# Patient Record
Sex: Male | Born: 1950
Health system: Southern US, Community
[De-identification: ages and names within clinical notes are randomized; demographics above are authoritative.]

## PROBLEM LIST (undated history)

## (undated) DIAGNOSIS — I447 Left bundle-branch block, unspecified: Secondary | ICD-10-CM

## (undated) DIAGNOSIS — E785 Hyperlipidemia, unspecified: Secondary | ICD-10-CM

## (undated) DIAGNOSIS — M1990 Unspecified osteoarthritis, unspecified site: Secondary | ICD-10-CM

## (undated) DIAGNOSIS — I10 Essential (primary) hypertension: Secondary | ICD-10-CM

## (undated) DIAGNOSIS — G4730 Sleep apnea, unspecified: Secondary | ICD-10-CM

## (undated) DIAGNOSIS — M109 Gout, unspecified: Secondary | ICD-10-CM

## (undated) DIAGNOSIS — K219 Gastro-esophageal reflux disease without esophagitis: Secondary | ICD-10-CM

## (undated) DIAGNOSIS — H409 Unspecified glaucoma: Secondary | ICD-10-CM

## (undated) DIAGNOSIS — Z87442 Personal history of urinary calculi: Secondary | ICD-10-CM

## (undated) DIAGNOSIS — K759 Inflammatory liver disease, unspecified: Secondary | ICD-10-CM

## (undated) HISTORY — DX: Hyperlipidemia, unspecified: E78.5

## (undated) HISTORY — DX: Unspecified glaucoma: H40.9

## (undated) HISTORY — DX: Gastro-esophageal reflux disease without esophagitis: K21.9

## (undated) HISTORY — PX: POLYPECTOMY: SHX149

## (undated) HISTORY — DX: Essential (primary) hypertension: I10

## (undated) HISTORY — DX: Sleep apnea, unspecified: G47.30

## (undated) HISTORY — PX: COLONOSCOPY: SHX174

## (undated) HISTORY — PX: ROTATOR CUFF REPAIR: SHX139

---

## 1998-10-28 DIAGNOSIS — N200 Calculus of kidney: Secondary | ICD-10-CM

## 1998-10-28 HISTORY — DX: Calculus of kidney: N20.0

## 2007-01-28 ENCOUNTER — Ambulatory Visit (HOSPITAL_BASED_OUTPATIENT_CLINIC_OR_DEPARTMENT_OTHER): Admission: RE | Admit: 2007-01-28 | Discharge: 2007-01-28 | Payer: Self-pay | Admitting: Orthopedic Surgery

## 2007-10-29 HISTORY — PX: ANKLE RECONSTRUCTION: SHX1151

## 2011-04-24 ENCOUNTER — Emergency Department (HOSPITAL_COMMUNITY)
Admission: EM | Admit: 2011-04-24 | Discharge: 2011-04-24 | Disposition: A | Discharge status: Home / Self Care | Payer: PRIVATE HEALTH INSURANCE | Attending: Emergency Medicine | Admitting: Emergency Medicine

## 2011-04-24 DIAGNOSIS — I10 Essential (primary) hypertension: Secondary | ICD-10-CM | POA: Insufficient documentation

## 2011-04-24 DIAGNOSIS — E7800 Pure hypercholesterolemia, unspecified: Secondary | ICD-10-CM | POA: Insufficient documentation

## 2011-04-24 DIAGNOSIS — R339 Retention of urine, unspecified: Secondary | ICD-10-CM | POA: Insufficient documentation

## 2011-04-24 DIAGNOSIS — E869 Volume depletion, unspecified: Secondary | ICD-10-CM | POA: Insufficient documentation

## 2011-04-24 LAB — POCT I-STAT, CHEM 8
Calcium, Ion: 1.09 mmol/L — ABNORMAL LOW (ref 1.12–1.32)
Chloride: 106 mEq/L (ref 96–112)
HCT: 38 % — ABNORMAL LOW (ref 39.0–52.0)
Hemoglobin: 12.9 g/dL — ABNORMAL LOW (ref 13.0–17.0)
Potassium: 4 mEq/L (ref 3.5–5.1)

## 2011-04-24 LAB — URINALYSIS, ROUTINE W REFLEX MICROSCOPIC
Leukocytes, UA: NEGATIVE
Nitrite: NEGATIVE
Specific Gravity, Urine: 1.013 (ref 1.005–1.030)
pH: 7.5 (ref 5.0–8.0)

## 2016-01-10 DIAGNOSIS — H2513 Age-related nuclear cataract, bilateral: Secondary | ICD-10-CM | POA: Diagnosis not present

## 2016-01-10 DIAGNOSIS — R238 Other skin changes: Secondary | ICD-10-CM | POA: Diagnosis not present

## 2016-01-10 DIAGNOSIS — H401123 Primary open-angle glaucoma, left eye, severe stage: Secondary | ICD-10-CM | POA: Diagnosis not present

## 2016-01-10 DIAGNOSIS — M25569 Pain in unspecified knee: Secondary | ICD-10-CM | POA: Diagnosis not present

## 2016-01-10 DIAGNOSIS — H401113 Primary open-angle glaucoma, right eye, severe stage: Secondary | ICD-10-CM | POA: Diagnosis not present

## 2016-01-10 DIAGNOSIS — Z791 Long term (current) use of non-steroidal anti-inflammatories (NSAID): Secondary | ICD-10-CM | POA: Diagnosis not present

## 2016-01-10 DIAGNOSIS — Z79899 Other long term (current) drug therapy: Secondary | ICD-10-CM | POA: Diagnosis not present

## 2016-03-04 DIAGNOSIS — L570 Actinic keratosis: Secondary | ICD-10-CM | POA: Diagnosis not present

## 2016-03-04 DIAGNOSIS — L821 Other seborrheic keratosis: Secondary | ICD-10-CM | POA: Diagnosis not present

## 2016-03-04 DIAGNOSIS — D499 Neoplasm of unspecified behavior of unspecified site: Secondary | ICD-10-CM | POA: Diagnosis not present

## 2016-04-10 DIAGNOSIS — H401123 Primary open-angle glaucoma, left eye, severe stage: Secondary | ICD-10-CM | POA: Diagnosis not present

## 2016-04-10 DIAGNOSIS — Z79899 Other long term (current) drug therapy: Secondary | ICD-10-CM | POA: Diagnosis not present

## 2016-04-10 DIAGNOSIS — Z791 Long term (current) use of non-steroidal anti-inflammatories (NSAID): Secondary | ICD-10-CM | POA: Diagnosis not present

## 2016-04-10 DIAGNOSIS — H47293 Other optic atrophy, bilateral: Secondary | ICD-10-CM | POA: Diagnosis not present

## 2016-04-10 DIAGNOSIS — H2513 Age-related nuclear cataract, bilateral: Secondary | ICD-10-CM | POA: Diagnosis not present

## 2016-04-10 DIAGNOSIS — Z792 Long term (current) use of antibiotics: Secondary | ICD-10-CM | POA: Diagnosis not present

## 2016-04-10 DIAGNOSIS — H401113 Primary open-angle glaucoma, right eye, severe stage: Secondary | ICD-10-CM | POA: Diagnosis not present

## 2016-05-20 DIAGNOSIS — Z471 Aftercare following joint replacement surgery: Secondary | ICD-10-CM | POA: Diagnosis not present

## 2016-05-20 DIAGNOSIS — Z96661 Presence of right artificial ankle joint: Secondary | ICD-10-CM | POA: Diagnosis not present

## 2016-08-07 DIAGNOSIS — H401123 Primary open-angle glaucoma, left eye, severe stage: Secondary | ICD-10-CM | POA: Diagnosis not present

## 2016-08-07 DIAGNOSIS — H401113 Primary open-angle glaucoma, right eye, severe stage: Secondary | ICD-10-CM | POA: Diagnosis not present

## 2016-08-07 DIAGNOSIS — H401133 Primary open-angle glaucoma, bilateral, severe stage: Secondary | ICD-10-CM | POA: Diagnosis not present

## 2016-08-26 DIAGNOSIS — E7800 Pure hypercholesterolemia, unspecified: Secondary | ICD-10-CM | POA: Diagnosis not present

## 2016-08-26 DIAGNOSIS — Z79899 Other long term (current) drug therapy: Secondary | ICD-10-CM | POA: Diagnosis not present

## 2016-08-26 DIAGNOSIS — Z23 Encounter for immunization: Secondary | ICD-10-CM | POA: Diagnosis not present

## 2016-08-26 DIAGNOSIS — K219 Gastro-esophageal reflux disease without esophagitis: Secondary | ICD-10-CM | POA: Diagnosis not present

## 2016-08-26 DIAGNOSIS — Z125 Encounter for screening for malignant neoplasm of prostate: Secondary | ICD-10-CM | POA: Diagnosis not present

## 2016-08-26 DIAGNOSIS — I10 Essential (primary) hypertension: Secondary | ICD-10-CM | POA: Diagnosis not present

## 2016-08-26 DIAGNOSIS — E669 Obesity, unspecified: Secondary | ICD-10-CM | POA: Diagnosis not present

## 2016-08-26 DIAGNOSIS — G4730 Sleep apnea, unspecified: Secondary | ICD-10-CM | POA: Diagnosis not present

## 2016-08-26 DIAGNOSIS — M150 Primary generalized (osteo)arthritis: Secondary | ICD-10-CM | POA: Diagnosis not present

## 2016-08-26 DIAGNOSIS — Z0000 Encounter for general adult medical examination without abnormal findings: Secondary | ICD-10-CM | POA: Diagnosis not present

## 2016-08-26 DIAGNOSIS — Z6833 Body mass index (BMI) 33.0-33.9, adult: Secondary | ICD-10-CM | POA: Diagnosis not present

## 2016-08-26 DIAGNOSIS — M1000 Idiopathic gout, unspecified site: Secondary | ICD-10-CM | POA: Diagnosis not present

## 2016-08-27 DIAGNOSIS — Z125 Encounter for screening for malignant neoplasm of prostate: Secondary | ICD-10-CM | POA: Diagnosis not present

## 2016-08-27 DIAGNOSIS — G4730 Sleep apnea, unspecified: Secondary | ICD-10-CM | POA: Diagnosis not present

## 2016-08-27 DIAGNOSIS — E7800 Pure hypercholesterolemia, unspecified: Secondary | ICD-10-CM | POA: Diagnosis not present

## 2016-08-27 DIAGNOSIS — Z0000 Encounter for general adult medical examination without abnormal findings: Secondary | ICD-10-CM | POA: Diagnosis not present

## 2016-08-27 DIAGNOSIS — I10 Essential (primary) hypertension: Secondary | ICD-10-CM | POA: Diagnosis not present

## 2016-08-27 DIAGNOSIS — M1000 Idiopathic gout, unspecified site: Secondary | ICD-10-CM | POA: Diagnosis not present

## 2016-08-27 DIAGNOSIS — Z79899 Other long term (current) drug therapy: Secondary | ICD-10-CM | POA: Diagnosis not present

## 2016-09-05 DIAGNOSIS — R531 Weakness: Secondary | ICD-10-CM | POA: Diagnosis not present

## 2016-09-05 DIAGNOSIS — L400 Psoriasis vulgaris: Secondary | ICD-10-CM | POA: Diagnosis not present

## 2016-09-05 DIAGNOSIS — L4050 Arthropathic psoriasis, unspecified: Secondary | ICD-10-CM | POA: Diagnosis not present

## 2016-11-11 DIAGNOSIS — J069 Acute upper respiratory infection, unspecified: Secondary | ICD-10-CM | POA: Diagnosis not present

## 2016-11-25 DIAGNOSIS — J209 Acute bronchitis, unspecified: Secondary | ICD-10-CM | POA: Diagnosis not present

## 2016-12-02 DIAGNOSIS — H6121 Impacted cerumen, right ear: Secondary | ICD-10-CM | POA: Diagnosis not present

## 2016-12-12 DIAGNOSIS — H401123 Primary open-angle glaucoma, left eye, severe stage: Secondary | ICD-10-CM | POA: Diagnosis not present

## 2016-12-12 DIAGNOSIS — H2513 Age-related nuclear cataract, bilateral: Secondary | ICD-10-CM | POA: Diagnosis not present

## 2016-12-12 DIAGNOSIS — H401113 Primary open-angle glaucoma, right eye, severe stage: Secondary | ICD-10-CM | POA: Diagnosis not present

## 2016-12-23 DIAGNOSIS — L570 Actinic keratosis: Secondary | ICD-10-CM | POA: Diagnosis not present

## 2016-12-23 DIAGNOSIS — G4730 Sleep apnea, unspecified: Secondary | ICD-10-CM | POA: Diagnosis not present

## 2017-01-17 DIAGNOSIS — G4733 Obstructive sleep apnea (adult) (pediatric): Secondary | ICD-10-CM | POA: Diagnosis not present

## 2017-01-17 DIAGNOSIS — J301 Allergic rhinitis due to pollen: Secondary | ICD-10-CM | POA: Diagnosis not present

## 2017-01-17 DIAGNOSIS — J4530 Mild persistent asthma, uncomplicated: Secondary | ICD-10-CM | POA: Diagnosis not present

## 2017-01-21 DIAGNOSIS — L400 Psoriasis vulgaris: Secondary | ICD-10-CM | POA: Diagnosis not present

## 2017-01-21 DIAGNOSIS — L4050 Arthropathic psoriasis, unspecified: Secondary | ICD-10-CM | POA: Diagnosis not present

## 2017-01-30 DIAGNOSIS — H401123 Primary open-angle glaucoma, left eye, severe stage: Secondary | ICD-10-CM | POA: Diagnosis not present

## 2017-01-30 DIAGNOSIS — H401113 Primary open-angle glaucoma, right eye, severe stage: Secondary | ICD-10-CM | POA: Diagnosis not present

## 2017-02-07 DIAGNOSIS — H401123 Primary open-angle glaucoma, left eye, severe stage: Secondary | ICD-10-CM | POA: Diagnosis not present

## 2017-02-12 DIAGNOSIS — G4733 Obstructive sleep apnea (adult) (pediatric): Secondary | ICD-10-CM | POA: Diagnosis not present

## 2017-02-20 DIAGNOSIS — J4520 Mild intermittent asthma, uncomplicated: Secondary | ICD-10-CM | POA: Diagnosis not present

## 2017-02-20 DIAGNOSIS — G4700 Insomnia, unspecified: Secondary | ICD-10-CM | POA: Diagnosis not present

## 2017-02-20 DIAGNOSIS — J301 Allergic rhinitis due to pollen: Secondary | ICD-10-CM | POA: Diagnosis not present

## 2017-02-20 DIAGNOSIS — G4733 Obstructive sleep apnea (adult) (pediatric): Secondary | ICD-10-CM | POA: Diagnosis not present

## 2017-02-24 DIAGNOSIS — Z79899 Other long term (current) drug therapy: Secondary | ICD-10-CM | POA: Diagnosis not present

## 2017-02-24 DIAGNOSIS — I10 Essential (primary) hypertension: Secondary | ICD-10-CM | POA: Diagnosis not present

## 2017-02-24 DIAGNOSIS — E7800 Pure hypercholesterolemia, unspecified: Secondary | ICD-10-CM | POA: Diagnosis not present

## 2017-02-25 DIAGNOSIS — Z79899 Other long term (current) drug therapy: Secondary | ICD-10-CM | POA: Diagnosis not present

## 2017-02-25 DIAGNOSIS — L4050 Arthropathic psoriasis, unspecified: Secondary | ICD-10-CM | POA: Diagnosis not present

## 2017-02-25 DIAGNOSIS — E7800 Pure hypercholesterolemia, unspecified: Secondary | ICD-10-CM | POA: Diagnosis not present

## 2017-02-25 DIAGNOSIS — I10 Essential (primary) hypertension: Secondary | ICD-10-CM | POA: Diagnosis not present

## 2017-02-25 DIAGNOSIS — L400 Psoriasis vulgaris: Secondary | ICD-10-CM | POA: Diagnosis not present

## 2017-03-03 DIAGNOSIS — Z79899 Other long term (current) drug therapy: Secondary | ICD-10-CM | POA: Diagnosis not present

## 2017-03-05 DIAGNOSIS — H401123 Primary open-angle glaucoma, left eye, severe stage: Secondary | ICD-10-CM | POA: Diagnosis not present

## 2017-03-05 DIAGNOSIS — H2513 Age-related nuclear cataract, bilateral: Secondary | ICD-10-CM | POA: Diagnosis not present

## 2017-03-05 DIAGNOSIS — H401113 Primary open-angle glaucoma, right eye, severe stage: Secondary | ICD-10-CM | POA: Diagnosis not present

## 2017-03-05 DIAGNOSIS — H401133 Primary open-angle glaucoma, bilateral, severe stage: Secondary | ICD-10-CM | POA: Diagnosis not present

## 2017-03-28 DIAGNOSIS — L4050 Arthropathic psoriasis, unspecified: Secondary | ICD-10-CM | POA: Diagnosis not present

## 2017-03-28 DIAGNOSIS — L400 Psoriasis vulgaris: Secondary | ICD-10-CM | POA: Diagnosis not present

## 2017-04-02 DIAGNOSIS — R05 Cough: Secondary | ICD-10-CM | POA: Diagnosis not present

## 2017-04-10 DIAGNOSIS — G4733 Obstructive sleep apnea (adult) (pediatric): Secondary | ICD-10-CM | POA: Diagnosis not present

## 2017-05-09 DIAGNOSIS — G4733 Obstructive sleep apnea (adult) (pediatric): Secondary | ICD-10-CM | POA: Diagnosis not present

## 2017-05-13 DIAGNOSIS — G4733 Obstructive sleep apnea (adult) (pediatric): Secondary | ICD-10-CM | POA: Diagnosis not present

## 2017-06-05 DIAGNOSIS — H401123 Primary open-angle glaucoma, left eye, severe stage: Secondary | ICD-10-CM | POA: Diagnosis not present

## 2017-06-05 DIAGNOSIS — H2513 Age-related nuclear cataract, bilateral: Secondary | ICD-10-CM | POA: Diagnosis not present

## 2017-06-05 DIAGNOSIS — H401113 Primary open-angle glaucoma, right eye, severe stage: Secondary | ICD-10-CM | POA: Diagnosis not present

## 2017-06-05 DIAGNOSIS — H401133 Primary open-angle glaucoma, bilateral, severe stage: Secondary | ICD-10-CM | POA: Diagnosis not present

## 2017-06-09 DIAGNOSIS — G4733 Obstructive sleep apnea (adult) (pediatric): Secondary | ICD-10-CM | POA: Diagnosis not present

## 2017-06-11 DIAGNOSIS — G4733 Obstructive sleep apnea (adult) (pediatric): Secondary | ICD-10-CM | POA: Diagnosis not present

## 2017-06-16 DIAGNOSIS — R5383 Other fatigue: Secondary | ICD-10-CM | POA: Diagnosis not present

## 2017-06-16 DIAGNOSIS — G4761 Periodic limb movement disorder: Secondary | ICD-10-CM | POA: Diagnosis not present

## 2017-06-16 DIAGNOSIS — G4733 Obstructive sleep apnea (adult) (pediatric): Secondary | ICD-10-CM | POA: Diagnosis not present

## 2017-07-01 DIAGNOSIS — L4050 Arthropathic psoriasis, unspecified: Secondary | ICD-10-CM | POA: Diagnosis not present

## 2017-07-01 DIAGNOSIS — R531 Weakness: Secondary | ICD-10-CM | POA: Diagnosis not present

## 2017-07-01 DIAGNOSIS — L400 Psoriasis vulgaris: Secondary | ICD-10-CM | POA: Diagnosis not present

## 2017-07-10 DIAGNOSIS — G4733 Obstructive sleep apnea (adult) (pediatric): Secondary | ICD-10-CM | POA: Diagnosis not present

## 2017-07-21 DIAGNOSIS — H401133 Primary open-angle glaucoma, bilateral, severe stage: Secondary | ICD-10-CM | POA: Diagnosis not present

## 2017-07-21 DIAGNOSIS — H401113 Primary open-angle glaucoma, right eye, severe stage: Secondary | ICD-10-CM | POA: Diagnosis not present

## 2017-07-21 DIAGNOSIS — H401123 Primary open-angle glaucoma, left eye, severe stage: Secondary | ICD-10-CM | POA: Diagnosis not present

## 2017-07-28 DIAGNOSIS — G4733 Obstructive sleep apnea (adult) (pediatric): Secondary | ICD-10-CM | POA: Diagnosis not present

## 2017-07-28 DIAGNOSIS — L400 Psoriasis vulgaris: Secondary | ICD-10-CM | POA: Diagnosis not present

## 2017-07-28 DIAGNOSIS — L4050 Arthropathic psoriasis, unspecified: Secondary | ICD-10-CM | POA: Diagnosis not present

## 2017-07-28 DIAGNOSIS — G4761 Periodic limb movement disorder: Secondary | ICD-10-CM | POA: Diagnosis not present

## 2017-07-28 DIAGNOSIS — J301 Allergic rhinitis due to pollen: Secondary | ICD-10-CM | POA: Diagnosis not present

## 2017-08-09 DIAGNOSIS — G4733 Obstructive sleep apnea (adult) (pediatric): Secondary | ICD-10-CM | POA: Diagnosis not present

## 2017-09-09 DIAGNOSIS — G4733 Obstructive sleep apnea (adult) (pediatric): Secondary | ICD-10-CM | POA: Diagnosis not present

## 2017-09-25 DIAGNOSIS — G4733 Obstructive sleep apnea (adult) (pediatric): Secondary | ICD-10-CM | POA: Diagnosis not present

## 2017-10-09 DIAGNOSIS — G4733 Obstructive sleep apnea (adult) (pediatric): Secondary | ICD-10-CM | POA: Diagnosis not present

## 2017-11-03 DIAGNOSIS — J301 Allergic rhinitis due to pollen: Secondary | ICD-10-CM | POA: Diagnosis not present

## 2017-11-03 DIAGNOSIS — J4520 Mild intermittent asthma, uncomplicated: Secondary | ICD-10-CM | POA: Diagnosis not present

## 2017-11-03 DIAGNOSIS — G4733 Obstructive sleep apnea (adult) (pediatric): Secondary | ICD-10-CM | POA: Diagnosis not present

## 2017-11-03 DIAGNOSIS — G4761 Periodic limb movement disorder: Secondary | ICD-10-CM | POA: Diagnosis not present

## 2017-11-24 DIAGNOSIS — H401123 Primary open-angle glaucoma, left eye, severe stage: Secondary | ICD-10-CM | POA: Diagnosis not present

## 2017-11-24 DIAGNOSIS — H401133 Primary open-angle glaucoma, bilateral, severe stage: Secondary | ICD-10-CM | POA: Diagnosis not present

## 2017-11-24 DIAGNOSIS — H401113 Primary open-angle glaucoma, right eye, severe stage: Secondary | ICD-10-CM | POA: Diagnosis not present

## 2017-12-22 DIAGNOSIS — H401133 Primary open-angle glaucoma, bilateral, severe stage: Secondary | ICD-10-CM | POA: Diagnosis not present

## 2017-12-22 DIAGNOSIS — H401113 Primary open-angle glaucoma, right eye, severe stage: Secondary | ICD-10-CM | POA: Diagnosis not present

## 2017-12-22 DIAGNOSIS — H401123 Primary open-angle glaucoma, left eye, severe stage: Secondary | ICD-10-CM | POA: Diagnosis not present

## 2018-02-02 DIAGNOSIS — J301 Allergic rhinitis due to pollen: Secondary | ICD-10-CM | POA: Diagnosis not present

## 2018-02-02 DIAGNOSIS — L400 Psoriasis vulgaris: Secondary | ICD-10-CM | POA: Diagnosis not present

## 2018-02-02 DIAGNOSIS — J4520 Mild intermittent asthma, uncomplicated: Secondary | ICD-10-CM | POA: Diagnosis not present

## 2018-02-02 DIAGNOSIS — G4761 Periodic limb movement disorder: Secondary | ICD-10-CM | POA: Diagnosis not present

## 2018-02-02 DIAGNOSIS — G4733 Obstructive sleep apnea (adult) (pediatric): Secondary | ICD-10-CM | POA: Diagnosis not present

## 2018-02-02 DIAGNOSIS — L4050 Arthropathic psoriasis, unspecified: Secondary | ICD-10-CM | POA: Diagnosis not present

## 2018-03-25 DIAGNOSIS — H401133 Primary open-angle glaucoma, bilateral, severe stage: Secondary | ICD-10-CM | POA: Diagnosis not present

## 2018-03-25 DIAGNOSIS — Z79899 Other long term (current) drug therapy: Secondary | ICD-10-CM | POA: Diagnosis not present

## 2018-03-25 DIAGNOSIS — H401123 Primary open-angle glaucoma, left eye, severe stage: Secondary | ICD-10-CM | POA: Diagnosis not present

## 2018-03-25 DIAGNOSIS — H401113 Primary open-angle glaucoma, right eye, severe stage: Secondary | ICD-10-CM | POA: Diagnosis not present

## 2018-04-13 DIAGNOSIS — L400 Psoriasis vulgaris: Secondary | ICD-10-CM | POA: Diagnosis not present

## 2018-04-13 DIAGNOSIS — R531 Weakness: Secondary | ICD-10-CM | POA: Diagnosis not present

## 2018-04-20 DIAGNOSIS — I10 Essential (primary) hypertension: Secondary | ICD-10-CM | POA: Diagnosis not present

## 2018-04-20 DIAGNOSIS — Z79899 Other long term (current) drug therapy: Secondary | ICD-10-CM | POA: Diagnosis not present

## 2018-04-20 DIAGNOSIS — E669 Obesity, unspecified: Secondary | ICD-10-CM | POA: Diagnosis not present

## 2018-04-20 DIAGNOSIS — Z6834 Body mass index (BMI) 34.0-34.9, adult: Secondary | ICD-10-CM | POA: Diagnosis not present

## 2018-04-20 DIAGNOSIS — E7800 Pure hypercholesterolemia, unspecified: Secondary | ICD-10-CM | POA: Diagnosis not present

## 2018-05-25 DIAGNOSIS — M25571 Pain in right ankle and joints of right foot: Secondary | ICD-10-CM | POA: Diagnosis not present

## 2018-05-25 DIAGNOSIS — Z96661 Presence of right artificial ankle joint: Secondary | ICD-10-CM | POA: Diagnosis not present

## 2018-06-08 DIAGNOSIS — G4761 Periodic limb movement disorder: Secondary | ICD-10-CM | POA: Diagnosis not present

## 2018-06-08 DIAGNOSIS — J301 Allergic rhinitis due to pollen: Secondary | ICD-10-CM | POA: Diagnosis not present

## 2018-06-08 DIAGNOSIS — G4733 Obstructive sleep apnea (adult) (pediatric): Secondary | ICD-10-CM | POA: Diagnosis not present

## 2018-06-08 DIAGNOSIS — J4520 Mild intermittent asthma, uncomplicated: Secondary | ICD-10-CM | POA: Diagnosis not present

## 2018-07-20 DIAGNOSIS — H401133 Primary open-angle glaucoma, bilateral, severe stage: Secondary | ICD-10-CM | POA: Diagnosis not present

## 2018-07-20 DIAGNOSIS — H401113 Primary open-angle glaucoma, right eye, severe stage: Secondary | ICD-10-CM | POA: Diagnosis not present

## 2018-07-20 DIAGNOSIS — H401123 Primary open-angle glaucoma, left eye, severe stage: Secondary | ICD-10-CM | POA: Diagnosis not present

## 2018-08-10 DIAGNOSIS — L400 Psoriasis vulgaris: Secondary | ICD-10-CM | POA: Diagnosis not present

## 2018-08-10 DIAGNOSIS — L578 Other skin changes due to chronic exposure to nonionizing radiation: Secondary | ICD-10-CM | POA: Diagnosis not present

## 2018-08-10 DIAGNOSIS — L4050 Arthropathic psoriasis, unspecified: Secondary | ICD-10-CM | POA: Diagnosis not present

## 2018-09-09 DIAGNOSIS — H401133 Primary open-angle glaucoma, bilateral, severe stage: Secondary | ICD-10-CM | POA: Diagnosis not present

## 2018-09-09 DIAGNOSIS — H401123 Primary open-angle glaucoma, left eye, severe stage: Secondary | ICD-10-CM | POA: Diagnosis not present

## 2018-09-09 DIAGNOSIS — H401113 Primary open-angle glaucoma, right eye, severe stage: Secondary | ICD-10-CM | POA: Diagnosis not present

## 2018-09-28 DIAGNOSIS — R531 Weakness: Secondary | ICD-10-CM | POA: Diagnosis not present

## 2018-09-28 DIAGNOSIS — E785 Hyperlipidemia, unspecified: Secondary | ICD-10-CM | POA: Diagnosis not present

## 2018-09-28 DIAGNOSIS — L400 Psoriasis vulgaris: Secondary | ICD-10-CM | POA: Diagnosis not present

## 2018-10-19 DIAGNOSIS — Z6834 Body mass index (BMI) 34.0-34.9, adult: Secondary | ICD-10-CM | POA: Diagnosis not present

## 2018-10-19 DIAGNOSIS — Z0000 Encounter for general adult medical examination without abnormal findings: Secondary | ICD-10-CM | POA: Diagnosis not present

## 2018-10-19 DIAGNOSIS — M545 Low back pain: Secondary | ICD-10-CM | POA: Diagnosis not present

## 2018-10-19 DIAGNOSIS — G4730 Sleep apnea, unspecified: Secondary | ICD-10-CM | POA: Diagnosis not present

## 2018-10-19 DIAGNOSIS — E7800 Pure hypercholesterolemia, unspecified: Secondary | ICD-10-CM | POA: Diagnosis not present

## 2018-10-19 DIAGNOSIS — K219 Gastro-esophageal reflux disease without esophagitis: Secondary | ICD-10-CM | POA: Diagnosis not present

## 2018-10-19 DIAGNOSIS — Z23 Encounter for immunization: Secondary | ICD-10-CM | POA: Diagnosis not present

## 2018-10-19 DIAGNOSIS — L409 Psoriasis, unspecified: Secondary | ICD-10-CM | POA: Diagnosis not present

## 2018-10-19 DIAGNOSIS — Z79899 Other long term (current) drug therapy: Secondary | ICD-10-CM | POA: Diagnosis not present

## 2018-10-19 DIAGNOSIS — M150 Primary generalized (osteo)arthritis: Secondary | ICD-10-CM | POA: Diagnosis not present

## 2018-10-19 DIAGNOSIS — E669 Obesity, unspecified: Secondary | ICD-10-CM | POA: Diagnosis not present

## 2018-10-19 DIAGNOSIS — I10 Essential (primary) hypertension: Secondary | ICD-10-CM | POA: Diagnosis not present

## 2018-10-20 ENCOUNTER — Encounter: Payer: Self-pay | Admitting: Gastroenterology

## 2018-11-02 DIAGNOSIS — M545 Low back pain: Secondary | ICD-10-CM | POA: Diagnosis not present

## 2018-11-02 DIAGNOSIS — M5442 Lumbago with sciatica, left side: Secondary | ICD-10-CM | POA: Diagnosis not present

## 2018-11-05 DIAGNOSIS — M5442 Lumbago with sciatica, left side: Secondary | ICD-10-CM | POA: Diagnosis not present

## 2018-11-05 DIAGNOSIS — M545 Low back pain: Secondary | ICD-10-CM | POA: Diagnosis not present

## 2018-11-09 DIAGNOSIS — M545 Low back pain: Secondary | ICD-10-CM | POA: Diagnosis not present

## 2018-11-09 DIAGNOSIS — M5442 Lumbago with sciatica, left side: Secondary | ICD-10-CM | POA: Diagnosis not present

## 2018-11-12 DIAGNOSIS — M5442 Lumbago with sciatica, left side: Secondary | ICD-10-CM | POA: Diagnosis not present

## 2018-11-12 DIAGNOSIS — M545 Low back pain: Secondary | ICD-10-CM | POA: Diagnosis not present

## 2018-11-17 DIAGNOSIS — M5442 Lumbago with sciatica, left side: Secondary | ICD-10-CM | POA: Diagnosis not present

## 2018-11-17 DIAGNOSIS — M545 Low back pain: Secondary | ICD-10-CM | POA: Diagnosis not present

## 2018-11-23 DIAGNOSIS — H401133 Primary open-angle glaucoma, bilateral, severe stage: Secondary | ICD-10-CM | POA: Diagnosis not present

## 2018-11-27 DIAGNOSIS — H401123 Primary open-angle glaucoma, left eye, severe stage: Secondary | ICD-10-CM | POA: Diagnosis not present

## 2018-12-07 DIAGNOSIS — J4520 Mild intermittent asthma, uncomplicated: Secondary | ICD-10-CM | POA: Diagnosis not present

## 2018-12-07 DIAGNOSIS — G4733 Obstructive sleep apnea (adult) (pediatric): Secondary | ICD-10-CM | POA: Diagnosis not present

## 2018-12-07 DIAGNOSIS — J301 Allergic rhinitis due to pollen: Secondary | ICD-10-CM | POA: Diagnosis not present

## 2018-12-07 DIAGNOSIS — G4761 Periodic limb movement disorder: Secondary | ICD-10-CM | POA: Diagnosis not present

## 2018-12-21 DIAGNOSIS — H401123 Primary open-angle glaucoma, left eye, severe stage: Secondary | ICD-10-CM | POA: Diagnosis not present

## 2018-12-21 DIAGNOSIS — H401113 Primary open-angle glaucoma, right eye, severe stage: Secondary | ICD-10-CM | POA: Diagnosis not present

## 2019-01-06 DIAGNOSIS — G4733 Obstructive sleep apnea (adult) (pediatric): Secondary | ICD-10-CM | POA: Diagnosis not present

## 2019-01-25 DIAGNOSIS — E7800 Pure hypercholesterolemia, unspecified: Secondary | ICD-10-CM | POA: Diagnosis not present

## 2019-01-25 DIAGNOSIS — Z6835 Body mass index (BMI) 35.0-35.9, adult: Secondary | ICD-10-CM | POA: Diagnosis not present

## 2019-01-25 DIAGNOSIS — M545 Low back pain: Secondary | ICD-10-CM | POA: Diagnosis not present

## 2019-01-25 DIAGNOSIS — D492 Neoplasm of unspecified behavior of bone, soft tissue, and skin: Secondary | ICD-10-CM | POA: Diagnosis not present

## 2019-01-25 DIAGNOSIS — E669 Obesity, unspecified: Secondary | ICD-10-CM | POA: Diagnosis not present

## 2019-01-25 DIAGNOSIS — I10 Essential (primary) hypertension: Secondary | ICD-10-CM | POA: Diagnosis not present

## 2019-02-15 DIAGNOSIS — L4050 Arthropathic psoriasis, unspecified: Secondary | ICD-10-CM | POA: Diagnosis not present

## 2019-02-15 DIAGNOSIS — L400 Psoriasis vulgaris: Secondary | ICD-10-CM | POA: Diagnosis not present

## 2019-02-22 DIAGNOSIS — H401113 Primary open-angle glaucoma, right eye, severe stage: Secondary | ICD-10-CM | POA: Diagnosis not present

## 2019-02-22 DIAGNOSIS — H401123 Primary open-angle glaucoma, left eye, severe stage: Secondary | ICD-10-CM | POA: Diagnosis not present

## 2019-05-06 DIAGNOSIS — D492 Neoplasm of unspecified behavior of bone, soft tissue, and skin: Secondary | ICD-10-CM | POA: Diagnosis not present

## 2019-05-24 DIAGNOSIS — H401123 Primary open-angle glaucoma, left eye, severe stage: Secondary | ICD-10-CM | POA: Diagnosis not present

## 2019-05-24 DIAGNOSIS — H401113 Primary open-angle glaucoma, right eye, severe stage: Secondary | ICD-10-CM | POA: Diagnosis not present

## 2019-05-24 DIAGNOSIS — H401133 Primary open-angle glaucoma, bilateral, severe stage: Secondary | ICD-10-CM | POA: Diagnosis not present

## 2019-05-24 DIAGNOSIS — Z79899 Other long term (current) drug therapy: Secondary | ICD-10-CM | POA: Diagnosis not present

## 2019-05-26 DIAGNOSIS — C4442 Squamous cell carcinoma of skin of scalp and neck: Secondary | ICD-10-CM | POA: Diagnosis not present

## 2019-07-26 DIAGNOSIS — Z79899 Other long term (current) drug therapy: Secondary | ICD-10-CM | POA: Diagnosis not present

## 2019-07-26 DIAGNOSIS — I10 Essential (primary) hypertension: Secondary | ICD-10-CM | POA: Diagnosis not present

## 2019-07-26 DIAGNOSIS — E7800 Pure hypercholesterolemia, unspecified: Secondary | ICD-10-CM | POA: Diagnosis not present

## 2019-07-26 DIAGNOSIS — E669 Obesity, unspecified: Secondary | ICD-10-CM | POA: Diagnosis not present

## 2019-07-26 DIAGNOSIS — M150 Primary generalized (osteo)arthritis: Secondary | ICD-10-CM | POA: Diagnosis not present

## 2019-07-26 DIAGNOSIS — M1000 Idiopathic gout, unspecified site: Secondary | ICD-10-CM | POA: Diagnosis not present

## 2019-07-26 DIAGNOSIS — Z6833 Body mass index (BMI) 33.0-33.9, adult: Secondary | ICD-10-CM | POA: Diagnosis not present

## 2019-07-26 DIAGNOSIS — Z23 Encounter for immunization: Secondary | ICD-10-CM | POA: Diagnosis not present

## 2019-08-09 DIAGNOSIS — L400 Psoriasis vulgaris: Secondary | ICD-10-CM | POA: Diagnosis not present

## 2019-08-09 DIAGNOSIS — L4050 Arthropathic psoriasis, unspecified: Secondary | ICD-10-CM | POA: Diagnosis not present

## 2019-08-23 DIAGNOSIS — H401133 Primary open-angle glaucoma, bilateral, severe stage: Secondary | ICD-10-CM | POA: Diagnosis not present

## 2019-09-21 ENCOUNTER — Other Ambulatory Visit: Payer: Self-pay

## 2019-10-25 DIAGNOSIS — Z125 Encounter for screening for malignant neoplasm of prostate: Secondary | ICD-10-CM | POA: Diagnosis not present

## 2019-10-25 DIAGNOSIS — G4730 Sleep apnea, unspecified: Secondary | ICD-10-CM | POA: Diagnosis not present

## 2019-10-25 DIAGNOSIS — Z0000 Encounter for general adult medical examination without abnormal findings: Secondary | ICD-10-CM | POA: Diagnosis not present

## 2019-10-25 DIAGNOSIS — L409 Psoriasis, unspecified: Secondary | ICD-10-CM | POA: Diagnosis not present

## 2019-10-25 DIAGNOSIS — I10 Essential (primary) hypertension: Secondary | ICD-10-CM | POA: Diagnosis not present

## 2019-10-25 DIAGNOSIS — E7800 Pure hypercholesterolemia, unspecified: Secondary | ICD-10-CM | POA: Diagnosis not present

## 2019-10-25 DIAGNOSIS — Z6832 Body mass index (BMI) 32.0-32.9, adult: Secondary | ICD-10-CM | POA: Diagnosis not present

## 2019-10-25 DIAGNOSIS — Z79899 Other long term (current) drug therapy: Secondary | ICD-10-CM | POA: Diagnosis not present

## 2019-10-25 DIAGNOSIS — M150 Primary generalized (osteo)arthritis: Secondary | ICD-10-CM | POA: Diagnosis not present

## 2019-10-25 DIAGNOSIS — E669 Obesity, unspecified: Secondary | ICD-10-CM | POA: Diagnosis not present

## 2019-10-25 DIAGNOSIS — E1169 Type 2 diabetes mellitus with other specified complication: Secondary | ICD-10-CM | POA: Diagnosis not present

## 2019-10-25 DIAGNOSIS — K219 Gastro-esophageal reflux disease without esophagitis: Secondary | ICD-10-CM | POA: Diagnosis not present

## 2019-10-25 DIAGNOSIS — R972 Elevated prostate specific antigen [PSA]: Secondary | ICD-10-CM | POA: Diagnosis not present

## 2019-10-25 DIAGNOSIS — M1000 Idiopathic gout, unspecified site: Secondary | ICD-10-CM | POA: Diagnosis not present

## 2019-11-08 DIAGNOSIS — Z2089 Contact with and (suspected) exposure to other communicable diseases: Secondary | ICD-10-CM | POA: Diagnosis not present

## 2019-11-08 DIAGNOSIS — J0100 Acute maxillary sinusitis, unspecified: Secondary | ICD-10-CM | POA: Diagnosis not present

## 2019-11-09 ENCOUNTER — Telehealth: Payer: Self-pay | Admitting: Gastroenterology

## 2019-11-09 NOTE — Telephone Encounter (Signed)
Hi Larena Glassman, we have received a referral from pt's PCP for a repeat colon and egd. Referral states that pt last colon was 9/16 with Dr. Lyndel Safe. There is a recall  Tab for the Egd but not for the colon. Could you please confirm that pt is also due for a colonoscopy so we can call him? Thank you.

## 2019-11-09 NOTE — Telephone Encounter (Signed)
Thank you, I will call pt.

## 2019-11-09 NOTE — Telephone Encounter (Signed)
Per Dr Leland Her last colonoscopy report, patient is due for a colonoscopy in 06/2020.

## 2019-12-09 DIAGNOSIS — L400 Psoriasis vulgaris: Secondary | ICD-10-CM | POA: Diagnosis not present

## 2019-12-09 DIAGNOSIS — L299 Pruritus, unspecified: Secondary | ICD-10-CM | POA: Diagnosis not present

## 2019-12-20 DIAGNOSIS — H401113 Primary open-angle glaucoma, right eye, severe stage: Secondary | ICD-10-CM | POA: Diagnosis not present

## 2019-12-20 DIAGNOSIS — H401123 Primary open-angle glaucoma, left eye, severe stage: Secondary | ICD-10-CM | POA: Diagnosis not present

## 2020-02-21 DIAGNOSIS — H401123 Primary open-angle glaucoma, left eye, severe stage: Secondary | ICD-10-CM | POA: Diagnosis not present

## 2020-02-21 DIAGNOSIS — H401113 Primary open-angle glaucoma, right eye, severe stage: Secondary | ICD-10-CM | POA: Diagnosis not present

## 2020-03-13 DIAGNOSIS — L299 Pruritus, unspecified: Secondary | ICD-10-CM | POA: Diagnosis not present

## 2020-03-13 DIAGNOSIS — L4050 Arthropathic psoriasis, unspecified: Secondary | ICD-10-CM | POA: Diagnosis not present

## 2020-03-13 DIAGNOSIS — L400 Psoriasis vulgaris: Secondary | ICD-10-CM | POA: Diagnosis not present

## 2020-04-24 DIAGNOSIS — E7800 Pure hypercholesterolemia, unspecified: Secondary | ICD-10-CM | POA: Diagnosis not present

## 2020-04-24 DIAGNOSIS — Z6833 Body mass index (BMI) 33.0-33.9, adult: Secondary | ICD-10-CM | POA: Diagnosis not present

## 2020-04-24 DIAGNOSIS — E669 Obesity, unspecified: Secondary | ICD-10-CM | POA: Diagnosis not present

## 2020-04-24 DIAGNOSIS — M1000 Idiopathic gout, unspecified site: Secondary | ICD-10-CM | POA: Diagnosis not present

## 2020-04-24 DIAGNOSIS — L409 Psoriasis, unspecified: Secondary | ICD-10-CM | POA: Diagnosis not present

## 2020-04-24 DIAGNOSIS — R202 Paresthesia of skin: Secondary | ICD-10-CM | POA: Diagnosis not present

## 2020-04-24 DIAGNOSIS — I10 Essential (primary) hypertension: Secondary | ICD-10-CM | POA: Diagnosis not present

## 2020-04-24 DIAGNOSIS — G4730 Sleep apnea, unspecified: Secondary | ICD-10-CM | POA: Diagnosis not present

## 2020-05-05 ENCOUNTER — Encounter: Payer: Self-pay | Admitting: Gastroenterology

## 2020-05-23 ENCOUNTER — Other Ambulatory Visit: Payer: Self-pay

## 2020-05-25 DIAGNOSIS — Z79899 Other long term (current) drug therapy: Secondary | ICD-10-CM | POA: Diagnosis not present

## 2020-05-25 DIAGNOSIS — H401133 Primary open-angle glaucoma, bilateral, severe stage: Secondary | ICD-10-CM | POA: Diagnosis not present

## 2020-07-10 ENCOUNTER — Encounter: Payer: PRIVATE HEALTH INSURANCE | Admitting: Gastroenterology

## 2020-07-24 ENCOUNTER — Ambulatory Visit (AMBULATORY_SURGERY_CENTER): Payer: PPO | Admitting: *Deleted

## 2020-07-24 ENCOUNTER — Other Ambulatory Visit: Payer: Self-pay

## 2020-07-24 VITALS — Ht 68.0 in | Wt 225.0 lb

## 2020-07-24 DIAGNOSIS — K22719 Barrett's esophagus with dysplasia, unspecified: Secondary | ICD-10-CM

## 2020-07-24 DIAGNOSIS — Z86010 Personal history of colonic polyps: Secondary | ICD-10-CM

## 2020-07-24 MED ORDER — SUTAB 1479-225-188 MG PO TABS: 1.0000 | ORAL_TABLET | Freq: Once | ORAL | 0 refills | Status: AC

## 2020-07-24 NOTE — Progress Notes (Signed)

## 2020-08-04 ENCOUNTER — Telehealth: Payer: Self-pay | Admitting: Gastroenterology

## 2020-08-04 NOTE — Telephone Encounter (Signed)
Pt's wife Edwena Felty called to inform that pt was exposed to Covid as of 10/3 due to a coworker tested positive today. Pt does not have any sxs and was  fully vaccinated last May. His PCP told him that he does not need to get tested for Covid because he is vaccinated. He is scheduled for a colon next Monday 10/11 with Dr. Lyndel Safe. Pt's wife wants to know if he needs to r/s. Appt. Pls call her.

## 2020-08-04 NOTE — Telephone Encounter (Signed)
Dr. Lyndel Safe was informed of the patient's exposure. As long as he isn't having symptom's patient can proceed as scheduled. Wife aware.

## 2020-08-07 ENCOUNTER — Ambulatory Visit (AMBULATORY_SURGERY_CENTER): Payer: PPO | Admitting: Gastroenterology

## 2020-08-07 ENCOUNTER — Other Ambulatory Visit: Payer: Self-pay

## 2020-08-07 ENCOUNTER — Encounter: Payer: Self-pay | Admitting: Gastroenterology

## 2020-08-07 VITALS — BP 125/77 | HR 57 | Temp 98.0°F | Resp 13 | Ht 68.0 in | Wt 225.0 lb

## 2020-08-07 DIAGNOSIS — K22719 Barrett's esophagus with dysplasia, unspecified: Secondary | ICD-10-CM | POA: Diagnosis not present

## 2020-08-07 DIAGNOSIS — K317 Polyp of stomach and duodenum: Secondary | ICD-10-CM

## 2020-08-07 DIAGNOSIS — K219 Gastro-esophageal reflux disease without esophagitis: Secondary | ICD-10-CM | POA: Diagnosis not present

## 2020-08-07 DIAGNOSIS — K2270 Barrett's esophagus without dysplasia: Secondary | ICD-10-CM | POA: Diagnosis not present

## 2020-08-07 DIAGNOSIS — D122 Benign neoplasm of ascending colon: Secondary | ICD-10-CM | POA: Diagnosis not present

## 2020-08-07 DIAGNOSIS — Z86010 Personal history of colonic polyps: Secondary | ICD-10-CM | POA: Diagnosis not present

## 2020-08-07 MED ORDER — OMEPRAZOLE 20 MG PO CPDR: 20.0000 mg | DELAYED_RELEASE_CAPSULE | Freq: Two times a day (BID) | ORAL | 11 refills | Status: AC

## 2020-08-07 MED ORDER — SODIUM CHLORIDE 0.9 % IV SOLN
500.0000 mL | Freq: Once | INTRAVENOUS | Status: DC
Start: 2020-08-07 — End: 2020-08-07

## 2020-08-07 NOTE — Progress Notes (Signed)
PT taken to PACU. Monitors in place. VSS. Report given to RN. 

## 2020-08-07 NOTE — Op Note (Signed)
[facility] Patient Name: Anthony Cisneros Procedure Date: 08/07/2020 2:10 PM MRN: [id number] Endoscopist: Jackquline Denmark , MD Age: 69 Referring MD:  Date of Birth: 12-23-1950 Gender: Male Account #: [id number] Procedure:                Upper GI endoscopy Indications:              Follow-up of Barrett's esophagus, GERD. Medicines:                Monitored Anesthesia Care Procedure:                Pre-Anesthesia Assessment:                           - Prior to the procedure, a History and Physical                            was performed, and patient medications and                            allergies were reviewed. The patient's tolerance of                            previous anesthesia was also reviewed. The risks                            and benefits of the procedure and the sedation                            options and risks were discussed with the patient.                            All questions were answered, and informed consent                            was obtained. Prior Anticoagulants: The patient has                            taken no previous anticoagulant or antiplatelet                            agents. ASA Grade Assessment: II - A patient with                            mild systemic disease. After reviewing the risks                            and benefits, the patient was deemed in                            satisfactory condition to undergo the procedure.                           - Prior to the procedure, a History and Physical  was performed, and patient medications and                            allergies were reviewed. The patient's tolerance of                            previous anesthesia was also reviewed. The risks                            and benefits of the procedure and the sedation                            options and risks were discussed with the patient.                            All questions were answered,  and informed consent                            was obtained. Prior Anticoagulants: The patient has                            taken no previous anticoagulant or antiplatelet                            agents. ASA Grade Assessment: II - A patient with                            mild systemic disease. After reviewing the risks                            and benefits, the patient was deemed in                            satisfactory condition to undergo the procedure.                           After obtaining informed consent, the endoscope was                            passed under direct vision. Throughout the                            procedure, the patient's blood pressure, pulse, and                            oxygen saturations were monitored continuously. The                            Endoscope was introduced through the mouth, and                            advanced to the second part of duodenum. The upper  GI endoscopy was accomplished without difficulty.                            The patient tolerated the procedure well. Scope In: Scope Out: Findings:                 The esophagus was mildly torturous. There were                            esophageal mucosal changes consistent with                            short-segment Barrett's esophagus present at the                            gastroesophageal junction. The maximum longitudinal                            extent of these mucosal changes was 1 cm in length                            with a tongue like projection above the GE                            Junction, examined by NBI. A small 6 mm clean based                            ulcer was noted at [employer]. Mucosa was biopsied                            with a cold forceps for histology in a targeted                            manner (directed by NBI) and from the ulcer. One                            specimen bottle was sent to pathology.                            A 3 cm hiatal hernia was present extending from 35                            upto 38 cm..                           Multiple (15-20) 6 to 8 mm semi-sessile polyps with                            no bleeding and no stigmata of recent bleeding were                            found in the gastric fundus and in the gastric  body. Biopsies were taken with a cold forceps for                            histology.                           The examined duodenum was normal. Complications:            No immediate complications. Estimated Blood Loss:     Estimated blood loss: none. Impression:               - Esophageal mucosal changes consistent with                            short-segment Barrett's esophagus. Biopsied.                           - 3 cm hiatal hernia.                           - Multiple gastric polyps. Biopsied.                           - Normal examined duodenum. Recommendation:           - Patient has a contact number available for                            emergencies. The signs and symptoms of potential                            delayed complications were discussed with the                            patient. Return to normal activities tomorrow.                            Written discharge instructions were provided to the                            patient.                           - Resume previous diet.                           - Increase omeprazole 20mg  po BID #60, 11 refills.                           - Await pathology results.                           - Repeat upper endoscopy for surveillance based on                            pathology results.                           -  The findings and recommendations were discussed                            with the patient's family. Jackquline Denmark, MD 08/07/2020 3:04:18 PM This report has been signed electronically.

## 2020-08-07 NOTE — Progress Notes (Signed)
Called to room to assist during endoscopic procedure.  Patient ID and intended procedure confirmed with present staff. Received instructions for my participation in the procedure from the performing physician.  

## 2020-08-07 NOTE — Op Note (Signed)
[facility] Patient Name: Anthony Cisneros Procedure Date: 08/07/2020 2:09 PM MRN: [id number] Endoscopist: Jackquline Denmark , MD Age: 69 Referring MD:  Date of Birth: 1951-02-17 Gender: Male Account #: [id number] Procedure:                Colonoscopy Indications:              High risk colon cancer surveillance: Personal                            history of colonic polyps Medicines:                Monitored Anesthesia Care Procedure:                Pre-Anesthesia Assessment:                           - Prior to the procedure, a History and Physical                            was performed, and patient medications and                            allergies were reviewed. The patient's tolerance of                            previous anesthesia was also reviewed. The risks                            and benefits of the procedure and the sedation                            options and risks were discussed with the patient.                            All questions were answered, and informed consent                            was obtained. Prior Anticoagulants: The patient has                            taken no previous anticoagulant or antiplatelet                            agents. ASA Grade Assessment: II - A patient with                            mild systemic disease. After reviewing the risks                            and benefits, the patient was deemed in                            satisfactory condition to undergo the procedure.  After obtaining informed consent, the colonoscope                            was passed under direct vision. Throughout the                            procedure, the patient's blood pressure, pulse, and                            oxygen saturations were monitored continuously. The                            Colonoscope was introduced through the anus and                            advanced to the the cecum, identified by                             appendiceal orifice and ileocecal valve. The                            colonoscopy was performed without difficulty. The                            patient tolerated the procedure well. The quality                            of the bowel preparation was good. Scope In: 2:31:54 PM Scope Out: 2:43:47 PM Scope Withdrawal Time: 0 hours 10 minutes 1 second  Total Procedure Duration: 0 hours 11 minutes 53 seconds  Findings:                 A 2 mm polyp was found in the proximal ascending                            colon. The polyp was sessile. The polyp was removed                            with a cold biopsy forceps. Resection and retrieval                            were complete.                           A 10 mm polyp was found in the distal ascending                            colon. The polyp was sessile. The polyp was removed                            with a hot snare. Resection and retrieval were                            complete. Estimated  blood loss: none. Note that                            cautery did malfunction briefly.                           Multiple small-mouthed diverticula were found in                            the sigmoid colon and descending colon.                           Non-bleeding external and internal hemorrhoids were                            found during retroflexion and during perianal exam.                            The hemorrhoids were moderate.                           The exam was otherwise without abnormality on                            direct and retroflexion views. Complications:            No immediate complications. Estimated Blood Loss:     Estimated blood loss: none. Impression:               - One 2 mm polyp in the proximal ascending colon,                            removed with a cold biopsy forceps. Resected and                            retrieved.                           - One 10 mm polyp in the distal  ascending colon,                            removed with a hot snare. Resected and retrieved.                           - Diverticulosis in the sigmoid colon and in the                            descending colon.                           - Non-bleeding external and internal hemorrhoids.                           - The examination was otherwise normal on direct  and retroflexion views. Recommendation:           - Patient has a contact number available for                            emergencies. The signs and symptoms of potential                            delayed complications were discussed with the                            patient. Return to normal activities tomorrow.                            Written discharge instructions were provided to the                            patient.                           - Resume previous diet.                           - Continue present medications.                           - Await pathology results.                           - Repeat colonoscopy for surveillance based on                            pathology results.                           - Return to GI clinic (date not yet determined).                           - The findings and recommendations were discussed                            with the patient's family. Jackquline Denmark, MD 08/07/2020 2:51:19 PM This report has been signed electronically.

## 2020-08-07 NOTE — Progress Notes (Signed)
Pt's states no medical or surgical changes since previsit or office visit. 

## 2020-08-07 NOTE — Progress Notes (Signed)
Pt. Reports he is on a CPAP machine for sleep apnea, that he has trouble breathing if he is laying flat on his back, but is fine when he is propped up.  Pt. Reports that during a previous procedure at [facility] 15 years ago, his heart waveform was abnormal.  Pt. Had cardiac catheterization.  No blockages, but a blood vessel "at the back of the heart" was smaller than the others.

## 2020-08-07 NOTE — Patient Instructions (Signed)
HANDOUTS PROVIDED ON: HIATAL HERNIA, POLYPS, DIVERTICULOSIS, & HEMORRHOIDS  The polyps removed/biopsies taken today have been sent for pathology.  The results can take 1-3 weeks to receive.  When your next colonoscopy should occur will be based on the pathology results.    You may resume your previous diet and medication schedule.  Begin taking Omeprazole 20 mg twice a day before breakfast and dinner.  A prescription with refills has been sent to your preferred pharmacy.  Thank you for allowing [place] to care for you today!!!   YOU HAD AN ENDOSCOPIC PROCEDURE TODAY AT [place]:   Refer to the procedure report that was given to you for any specific questions about what was found during the examination.  If the procedure report does not answer your questions, please call your gastroenterologist to clarify.  If you requested that your care partner not be given the details of your procedure findings, then the procedure report has been included in a sealed envelope for you to review at your convenience later.  YOU SHOULD EXPECT: Some feelings of bloating in the abdomen. Passage of more gas than usual.  Walking can help get rid of the air that was put into your GI tract during the procedure and reduce the bloating. If you had a lower endoscopy (such as a colonoscopy or flexible sigmoidoscopy) you may notice spotting of blood in your stool or on the toilet paper. If you underwent a bowel prep for your procedure, you may not have a normal bowel movement for a few days.  Please Note:  You might notice some irritation and congestion in your nose or some drainage.  This is from the oxygen used during your procedure.  There is no need for concern and it should clear up in a day or so.  SYMPTOMS TO REPORT IMMEDIATELY:   Following lower endoscopy (colonoscopy or flexible sigmoidoscopy):  Excessive amounts of blood in the stool  Significant tenderness or worsening of abdominal pains  Swelling  of the abdomen that is new, acute  Fever of 100F or higher   Following upper endoscopy (EGD)  Vomiting of blood or coffee ground material  New chest pain or pain under the shoulder blades  Painful or persistently difficult swallowing  New shortness of breath  Fever of 100F or higher  Black, tarry-looking stools  For urgent or emergent issues, a gastroenterologist can be reached at any hour by calling [phone]. Do not use MyChart messaging for urgent concerns.    DIET:  We do recommend a small meal at first, but then you may proceed to your regular diet.  Drink plenty of fluids but you should avoid alcoholic beverages for 24 hours.  ACTIVITY:  You should plan to take it easy for the rest of today and you should NOT DRIVE or use heavy machinery until tomorrow (because of the sedation medicines used during the test).    FOLLOW UP: Our staff will call the number listed on your records 48-72 hours following your procedure to check on you and address any questions or concerns that you may have regarding the information given to you following your procedure. If we do not reach you, we will leave a message.  We will attempt to reach you two times.  During this call, we will ask if you have developed any symptoms of COVID 19. If you develop any symptoms (ie: fever, flu-like symptoms, shortness of breath, cough etc.) before then, please call [phone].  If you  test positive for Covid 19 in the 2 weeks post procedure, please call and report this information to [place].    If any biopsies were taken you will be contacted by phone or by letter within the next 1-3 weeks.  Please call us at [phone] if you have not heard about the biopsies in 3 weeks.    SIGNATURES/CONFIDENTIALITY: You and/or your care partner have signed paperwork which will be entered into your electronic medical record.  These signatures attest to the fact that that the information above on your After Visit Summary has  been reviewed and is understood.  Full responsibility of the confidentiality of this discharge information lies with you and/or your care-partner.

## 2020-08-09 ENCOUNTER — Telehealth: Payer: Self-pay

## 2020-08-09 NOTE — Telephone Encounter (Signed)
  Follow up Call-  Call back number 08/07/2020  Post procedure Call Back phone  # [phone]  Permission to leave phone message Yes  Some recent data might be hidden     Patient questions:  Do you have a fever, pain , or abdominal swelling? Yes.   Pain Score  3 *  Have you tolerated food without any problems? Yes.    Have you been able to return to your normal activities? Yes.    Do you have any questions about your discharge instructions: Diet   No. Medications  No. Follow up visit  No.  Do you have questions or concerns about your Care? Yes.   Patient has been experiencing some lower abdominal pain rated a "3" since the procedure.  He has no fever, no nausea, vomiting or bleeding.  Patient is able to drink and eat with no problems and is back to his regular activities.  Explained to patient that if pain worsens or he starts experiencing any complications, then he needs to call.  Patient verbalized understanding.    Actions: * If pain score is 4 or above: No action needed, pain <4. 1. Have you developed a fever since your procedure? no  2.   Have you had an respiratory symptoms (SOB or cough) since your procedure? no  3.   Have you tested positive for COVID 19 since your procedure no  4.   Have you had any family members/close contacts diagnosed with the COVID 19 since your procedure?  no   If yes to any of these questions please route to Joylene John, RN and Joella Prince, RN

## 2020-08-17 ENCOUNTER — Encounter: Payer: Self-pay | Admitting: Gastroenterology

## 2020-08-21 DIAGNOSIS — Z79899 Other long term (current) drug therapy: Secondary | ICD-10-CM | POA: Diagnosis not present

## 2020-08-21 DIAGNOSIS — H401113 Primary open-angle glaucoma, right eye, severe stage: Secondary | ICD-10-CM | POA: Diagnosis not present

## 2020-08-21 DIAGNOSIS — H401123 Primary open-angle glaucoma, left eye, severe stage: Secondary | ICD-10-CM | POA: Diagnosis not present

## 2020-08-21 DIAGNOSIS — H401133 Primary open-angle glaucoma, bilateral, severe stage: Secondary | ICD-10-CM | POA: Diagnosis not present

## 2020-09-18 DIAGNOSIS — M25571 Pain in right ankle and joints of right foot: Secondary | ICD-10-CM | POA: Diagnosis not present

## 2020-09-18 DIAGNOSIS — M79671 Pain in right foot: Secondary | ICD-10-CM | POA: Diagnosis not present

## 2020-10-09 DIAGNOSIS — Z125 Encounter for screening for malignant neoplasm of prostate: Secondary | ICD-10-CM | POA: Diagnosis not present

## 2020-10-09 DIAGNOSIS — Z79899 Other long term (current) drug therapy: Secondary | ICD-10-CM | POA: Diagnosis not present

## 2020-10-09 DIAGNOSIS — L409 Psoriasis, unspecified: Secondary | ICD-10-CM | POA: Diagnosis not present

## 2020-10-09 DIAGNOSIS — Z23 Encounter for immunization: Secondary | ICD-10-CM | POA: Diagnosis not present

## 2020-10-09 DIAGNOSIS — E7800 Pure hypercholesterolemia, unspecified: Secondary | ICD-10-CM | POA: Diagnosis not present

## 2020-10-09 DIAGNOSIS — Z0000 Encounter for general adult medical examination without abnormal findings: Secondary | ICD-10-CM | POA: Diagnosis not present

## 2020-10-09 DIAGNOSIS — M1000 Idiopathic gout, unspecified site: Secondary | ICD-10-CM | POA: Diagnosis not present

## 2020-10-09 DIAGNOSIS — K219 Gastro-esophageal reflux disease without esophagitis: Secondary | ICD-10-CM | POA: Diagnosis not present

## 2020-10-09 DIAGNOSIS — G4730 Sleep apnea, unspecified: Secondary | ICD-10-CM | POA: Diagnosis not present

## 2020-10-09 DIAGNOSIS — I10 Essential (primary) hypertension: Secondary | ICD-10-CM | POA: Diagnosis not present

## 2020-10-09 DIAGNOSIS — M150 Primary generalized (osteo)arthritis: Secondary | ICD-10-CM | POA: Diagnosis not present

## 2020-11-09 DIAGNOSIS — L570 Actinic keratosis: Secondary | ICD-10-CM | POA: Diagnosis not present

## 2020-12-25 DIAGNOSIS — H401133 Primary open-angle glaucoma, bilateral, severe stage: Secondary | ICD-10-CM | POA: Diagnosis not present

## 2020-12-25 DIAGNOSIS — Z79899 Other long term (current) drug therapy: Secondary | ICD-10-CM | POA: Diagnosis not present

## 2020-12-25 DIAGNOSIS — H401113 Primary open-angle glaucoma, right eye, severe stage: Secondary | ICD-10-CM | POA: Diagnosis not present

## 2020-12-25 DIAGNOSIS — H401123 Primary open-angle glaucoma, left eye, severe stage: Secondary | ICD-10-CM | POA: Diagnosis not present

## 2021-03-27 DIAGNOSIS — M545 Low back pain, unspecified: Secondary | ICD-10-CM | POA: Diagnosis not present

## 2021-03-28 DIAGNOSIS — Z79899 Other long term (current) drug therapy: Secondary | ICD-10-CM | POA: Diagnosis not present

## 2021-03-28 DIAGNOSIS — H401133 Primary open-angle glaucoma, bilateral, severe stage: Secondary | ICD-10-CM | POA: Diagnosis not present

## 2021-04-09 DIAGNOSIS — E7800 Pure hypercholesterolemia, unspecified: Secondary | ICD-10-CM | POA: Diagnosis not present

## 2021-04-09 DIAGNOSIS — Z79899 Other long term (current) drug therapy: Secondary | ICD-10-CM | POA: Diagnosis not present

## 2021-04-09 DIAGNOSIS — G4730 Sleep apnea, unspecified: Secondary | ICD-10-CM | POA: Diagnosis not present

## 2021-04-09 DIAGNOSIS — I10 Essential (primary) hypertension: Secondary | ICD-10-CM | POA: Diagnosis not present

## 2021-04-09 DIAGNOSIS — M1000 Idiopathic gout, unspecified site: Secondary | ICD-10-CM | POA: Diagnosis not present

## 2021-04-09 DIAGNOSIS — Z6833 Body mass index (BMI) 33.0-33.9, adult: Secondary | ICD-10-CM | POA: Diagnosis not present

## 2021-04-09 DIAGNOSIS — L409 Psoriasis, unspecified: Secondary | ICD-10-CM | POA: Diagnosis not present

## 2021-07-09 DIAGNOSIS — H401123 Primary open-angle glaucoma, left eye, severe stage: Secondary | ICD-10-CM | POA: Diagnosis not present

## 2021-07-09 DIAGNOSIS — H401113 Primary open-angle glaucoma, right eye, severe stage: Secondary | ICD-10-CM | POA: Diagnosis not present

## 2021-08-07 DIAGNOSIS — Z23 Encounter for immunization: Secondary | ICD-10-CM | POA: Diagnosis not present

## 2021-08-07 DIAGNOSIS — R202 Paresthesia of skin: Secondary | ICD-10-CM | POA: Diagnosis not present

## 2021-10-02 DIAGNOSIS — G4733 Obstructive sleep apnea (adult) (pediatric): Secondary | ICD-10-CM | POA: Diagnosis not present

## 2021-10-08 DIAGNOSIS — K219 Gastro-esophageal reflux disease without esophagitis: Secondary | ICD-10-CM | POA: Diagnosis not present

## 2021-10-08 DIAGNOSIS — G4730 Sleep apnea, unspecified: Secondary | ICD-10-CM | POA: Diagnosis not present

## 2021-10-08 DIAGNOSIS — Z79899 Other long term (current) drug therapy: Secondary | ICD-10-CM | POA: Diagnosis not present

## 2021-10-08 DIAGNOSIS — Z0000 Encounter for general adult medical examination without abnormal findings: Secondary | ICD-10-CM | POA: Diagnosis not present

## 2021-10-08 DIAGNOSIS — M150 Primary generalized (osteo)arthritis: Secondary | ICD-10-CM | POA: Diagnosis not present

## 2021-10-08 DIAGNOSIS — Z125 Encounter for screening for malignant neoplasm of prostate: Secondary | ICD-10-CM | POA: Diagnosis not present

## 2021-10-08 DIAGNOSIS — M1000 Idiopathic gout, unspecified site: Secondary | ICD-10-CM | POA: Diagnosis not present

## 2021-10-08 DIAGNOSIS — L409 Psoriasis, unspecified: Secondary | ICD-10-CM | POA: Diagnosis not present

## 2021-10-08 DIAGNOSIS — M545 Low back pain, unspecified: Secondary | ICD-10-CM | POA: Diagnosis not present

## 2021-10-08 DIAGNOSIS — E669 Obesity, unspecified: Secondary | ICD-10-CM | POA: Diagnosis not present

## 2021-10-08 DIAGNOSIS — I10 Essential (primary) hypertension: Secondary | ICD-10-CM | POA: Diagnosis not present

## 2021-10-08 DIAGNOSIS — Z6833 Body mass index (BMI) 33.0-33.9, adult: Secondary | ICD-10-CM | POA: Diagnosis not present

## 2021-10-08 DIAGNOSIS — E7800 Pure hypercholesterolemia, unspecified: Secondary | ICD-10-CM | POA: Diagnosis not present

## 2021-10-10 DIAGNOSIS — H401133 Primary open-angle glaucoma, bilateral, severe stage: Secondary | ICD-10-CM | POA: Diagnosis not present

## 2021-10-15 DIAGNOSIS — M545 Low back pain, unspecified: Secondary | ICD-10-CM | POA: Diagnosis not present

## 2021-10-17 DIAGNOSIS — M545 Low back pain, unspecified: Secondary | ICD-10-CM | POA: Diagnosis not present

## 2021-10-23 DIAGNOSIS — M545 Low back pain, unspecified: Secondary | ICD-10-CM | POA: Diagnosis not present

## 2021-10-25 DIAGNOSIS — M545 Low back pain, unspecified: Secondary | ICD-10-CM | POA: Diagnosis not present

## 2021-10-30 DIAGNOSIS — M545 Low back pain, unspecified: Secondary | ICD-10-CM | POA: Diagnosis not present

## 2021-11-02 DIAGNOSIS — M545 Low back pain, unspecified: Secondary | ICD-10-CM | POA: Diagnosis not present

## 2021-11-06 DIAGNOSIS — M545 Low back pain, unspecified: Secondary | ICD-10-CM | POA: Diagnosis not present

## 2021-11-08 DIAGNOSIS — M545 Low back pain, unspecified: Secondary | ICD-10-CM | POA: Diagnosis not present

## 2021-11-12 DIAGNOSIS — M545 Low back pain, unspecified: Secondary | ICD-10-CM | POA: Diagnosis not present

## 2021-11-14 DIAGNOSIS — M545 Low back pain, unspecified: Secondary | ICD-10-CM | POA: Diagnosis not present

## 2021-11-20 DIAGNOSIS — M545 Low back pain, unspecified: Secondary | ICD-10-CM | POA: Diagnosis not present

## 2021-11-22 DIAGNOSIS — M545 Low back pain, unspecified: Secondary | ICD-10-CM | POA: Diagnosis not present

## 2021-11-27 DIAGNOSIS — M545 Low back pain, unspecified: Secondary | ICD-10-CM | POA: Diagnosis not present

## 2021-11-30 DIAGNOSIS — M545 Low back pain, unspecified: Secondary | ICD-10-CM | POA: Diagnosis not present

## 2021-12-05 DIAGNOSIS — M109 Gout, unspecified: Secondary | ICD-10-CM | POA: Diagnosis not present

## 2021-12-05 DIAGNOSIS — E785 Hyperlipidemia, unspecified: Secondary | ICD-10-CM | POA: Diagnosis not present

## 2021-12-05 DIAGNOSIS — K219 Gastro-esophageal reflux disease without esophagitis: Secondary | ICD-10-CM | POA: Diagnosis not present

## 2021-12-05 DIAGNOSIS — I10 Essential (primary) hypertension: Secondary | ICD-10-CM | POA: Diagnosis not present

## 2021-12-05 DIAGNOSIS — E669 Obesity, unspecified: Secondary | ICD-10-CM | POA: Diagnosis not present

## 2021-12-05 DIAGNOSIS — H401133 Primary open-angle glaucoma, bilateral, severe stage: Secondary | ICD-10-CM | POA: Diagnosis not present

## 2021-12-05 DIAGNOSIS — H401113 Primary open-angle glaucoma, right eye, severe stage: Secondary | ICD-10-CM | POA: Diagnosis not present

## 2021-12-05 DIAGNOSIS — H409 Unspecified glaucoma: Secondary | ICD-10-CM | POA: Diagnosis not present

## 2021-12-05 DIAGNOSIS — L4050 Arthropathic psoriasis, unspecified: Secondary | ICD-10-CM | POA: Diagnosis not present

## 2021-12-05 DIAGNOSIS — G4733 Obstructive sleep apnea (adult) (pediatric): Secondary | ICD-10-CM | POA: Diagnosis not present

## 2021-12-05 DIAGNOSIS — M1990 Unspecified osteoarthritis, unspecified site: Secondary | ICD-10-CM | POA: Diagnosis not present

## 2021-12-05 DIAGNOSIS — Z87891 Personal history of nicotine dependence: Secondary | ICD-10-CM | POA: Diagnosis not present

## 2022-01-02 DIAGNOSIS — G4733 Obstructive sleep apnea (adult) (pediatric): Secondary | ICD-10-CM | POA: Diagnosis not present

## 2022-01-24 DIAGNOSIS — M450 Ankylosing spondylitis of multiple sites in spine: Secondary | ICD-10-CM | POA: Diagnosis not present

## 2022-01-24 DIAGNOSIS — M545 Low back pain, unspecified: Secondary | ICD-10-CM | POA: Diagnosis not present

## 2022-01-24 DIAGNOSIS — M47816 Spondylosis without myelopathy or radiculopathy, lumbar region: Secondary | ICD-10-CM | POA: Diagnosis not present

## 2022-01-28 DIAGNOSIS — H401113 Primary open-angle glaucoma, right eye, severe stage: Secondary | ICD-10-CM | POA: Diagnosis not present

## 2022-01-28 DIAGNOSIS — H401123 Primary open-angle glaucoma, left eye, severe stage: Secondary | ICD-10-CM | POA: Diagnosis not present

## 2022-01-31 DIAGNOSIS — M545 Low back pain, unspecified: Secondary | ICD-10-CM | POA: Diagnosis not present

## 2022-01-31 DIAGNOSIS — M5137 Other intervertebral disc degeneration, lumbosacral region: Secondary | ICD-10-CM | POA: Diagnosis not present

## 2022-01-31 DIAGNOSIS — M5125 Other intervertebral disc displacement, thoracolumbar region: Secondary | ICD-10-CM | POA: Diagnosis not present

## 2022-02-21 DIAGNOSIS — M48062 Spinal stenosis, lumbar region with neurogenic claudication: Secondary | ICD-10-CM | POA: Diagnosis not present

## 2022-02-21 DIAGNOSIS — M47816 Spondylosis without myelopathy or radiculopathy, lumbar region: Secondary | ICD-10-CM | POA: Diagnosis not present

## 2022-02-21 DIAGNOSIS — M4807 Spinal stenosis, lumbosacral region: Secondary | ICD-10-CM | POA: Diagnosis not present

## 2022-02-21 DIAGNOSIS — M4317 Spondylolisthesis, lumbosacral region: Secondary | ICD-10-CM | POA: Diagnosis not present

## 2022-02-25 ENCOUNTER — Other Ambulatory Visit: Payer: Self-pay | Admitting: Neurosurgery

## 2022-03-04 NOTE — Progress Notes (Signed)
Surgical Instructions ? ? ? Your procedure is scheduled on Thursday, May 11. ? Report to [facility] Main Entrance "A" at 8:20 A.M., then check in with the Admitting office. ? Call this number if you have problems the morning of surgery: ? [phone] ? ? If you have any questions prior to your surgery date call [phone]: Open Monday-Friday 8am-4pm ? ? ? Remember: ? Do not eat or drink after midnight the night before your surgery ?  ? Take these medicines the morning of surgery with A SIP OF WATER:  ? ?allopurinol (ZYLOPRIM)  ?amLODipine (NORVASC)  ?loratadine (CLARITIN REDITABS)  ?omeprazole (PRILOSEC)  ?pravastatin (PRAVACHOL)  ?acetaminophen (TYLENOL)  ? ? ?As of today, STOP taking any Aspirin (unless otherwise instructed by your surgeon) Aleve, Naproxen, Ibuprofen, Motrin, Advil, Goody's, BC's, all herbal medications, fish oil, and all vitamins, meloxicam (MOBIC). ? ?         ?Do not wear jewelry or makeup ?Do not wear lotions, powders, perfumes/colognes, or deodorant. ?Do not shave 48 hours prior to surgery.  Men may shave face and neck. ?Do not bring valuables to the hospital. ?Do not wear nail polish, gel polish, artificial nails, or any other type of covering on natural nails (fingers and toes) ?If you have artificial nails or gel coating that need to be removed by a nail salon, please have this removed prior to surgery. Artificial nails or gel coating may interfere with anesthesia's ability to adequately monitor your vital signs. ? ?[facility] is not responsible for any belongings or valuables. .  ? ?Do NOT Smoke (Tobacco/Vaping)  24 hours prior to your procedure ? ?If you use a CPAP at night, you may bring your mask for your overnight stay. ?  ?Contacts, glasses, hearing aids, dentures or partials may not be worn into surgery, please bring cases for these belongings ?  ?For patients admitted to the hospital, discharge time will be determined by your treatment team. ?  ?Patients discharged the day of  surgery will not be allowed to drive home, and someone needs to stay with them for 24 hours. ? ? ?SURGICAL WAITING ROOM VISITATION ?Patients having surgery or a procedure in a hospital may have two support people. ?Children under the age of 2 must have an adult with them who is not the patient. ?They may stay in the waiting area during the procedure and may switch out with other visitors. If the patient needs to stay at the hospital during part of their recovery, the visitor guidelines for inpatient rooms apply. ? ?Please refer to the [facility] website for the visitor guidelines for Inpatients (after your surgery is over and you are in a regular room).  ? ? ? ? ? ?Special instructions:   ? ?Oral Hygiene is also important to reduce your risk of infection.  Remember - BRUSH YOUR TEETH THE MORNING OF SURGERY WITH YOUR REGULAR TOOTHPASTE ? ? ?[place]- Preparing For Surgery ? ?Before surgery, you can play an important role. Because skin is not sterile, your skin needs to be as free of germs as possible. You can reduce the number of germs on your skin by washing with CHG (chlorahexidine gluconate) Soap before surgery.  CHG is an antiseptic cleaner which kills germs and bonds with the skin to continue killing germs even after washing.   ? ? ?Please do not use if you have an allergy to CHG or antibacterial soaps. If your skin becomes reddened/irritated stop using the CHG.  ?Do not shave (including  legs and underarms) for at least 48 hours prior to first CHG shower. It is OK to shave your face. ? ?Please follow these instructions carefully. ?  ? ? Shower the NIGHT BEFORE SURGERY and the MORNING OF SURGERY with CHG Soap.  ? If you chose to wash your hair, wash your hair first as usual with your normal shampoo. After you shampoo, rinse your hair and body thoroughly to remove the shampoo.  Then [employer] and genitals (private parts) with your normal soap and rinse thoroughly to remove soap. ? ?After that Use CHG Soap as  you would any other liquid soap. You can apply CHG directly to the skin and wash gently with a scrungie or a clean washcloth.  ? ?Apply the CHG Soap to your body ONLY FROM THE NECK DOWN.  Do not use on open wounds or open sores. Avoid contact with your eyes, ears, mouth and genitals (private parts). Wash Face and genitals (private parts)  with your normal soap.  ? ?Wash thoroughly, paying special attention to the area where your surgery will be performed. ? ?Thoroughly rinse your body with warm water from the neck down. ? ?DO NOT shower/wash with your normal soap after using and rinsing off the CHG Soap. ? ?Pat yourself dry with a CLEAN TOWEL. ? ?Wear CLEAN PAJAMAS to bed the night before surgery ? ?Place CLEAN SHEETS on your bed the night before your surgery ? ?DO NOT SLEEP WITH PETS. ? ? ?Day of Surgery: ? ?Take a shower with CHG soap. ?Wear Clean/Comfortable clothing the morning of surgery ?Do not apply any deodorants/lotions.   ?Remember to brush your teeth WITH YOUR REGULAR TOOTHPASTE. ? ? ? ?If you received a COVID test during your pre-op visit, it is requested that you wear a mask when out in public, stay away from anyone that may not be feeling well, and notify your surgeon if you develop symptoms. If you have been in contact with anyone that has tested positive in the last 10 days, please notify your surgeon. ? ?  ?Please read over the following fact sheets that you were given.  ? ?

## 2022-03-05 ENCOUNTER — Encounter (HOSPITAL_COMMUNITY): Payer: Self-pay

## 2022-03-05 ENCOUNTER — Encounter (HOSPITAL_COMMUNITY)
Admission: RE | Admit: 2022-03-05 | Discharge: 2022-03-05 | Disposition: A | Discharge status: Home / Self Care | Payer: PPO | Source: Ambulatory Visit | Attending: Neurosurgery | Admitting: Neurosurgery

## 2022-03-05 ENCOUNTER — Other Ambulatory Visit: Payer: Self-pay | Admitting: Neurosurgery

## 2022-03-05 ENCOUNTER — Other Ambulatory Visit: Payer: Self-pay

## 2022-03-05 ENCOUNTER — Encounter (HOSPITAL_COMMUNITY): Payer: Self-pay | Admitting: Neurosurgery

## 2022-03-05 VITALS — BP 120/76 | HR 63 | Temp 98.5°F | Resp 17 | Ht 69.0 in | Wt 214.0 lb

## 2022-03-05 DIAGNOSIS — I10 Essential (primary) hypertension: Secondary | ICD-10-CM | POA: Diagnosis not present

## 2022-03-05 DIAGNOSIS — I447 Left bundle-branch block, unspecified: Secondary | ICD-10-CM | POA: Diagnosis not present

## 2022-03-05 DIAGNOSIS — M48062 Spinal stenosis, lumbar region with neurogenic claudication: Secondary | ICD-10-CM | POA: Insufficient documentation

## 2022-03-05 DIAGNOSIS — G4733 Obstructive sleep apnea (adult) (pediatric): Secondary | ICD-10-CM | POA: Diagnosis not present

## 2022-03-05 DIAGNOSIS — Z87891 Personal history of nicotine dependence: Secondary | ICD-10-CM | POA: Diagnosis not present

## 2022-03-05 DIAGNOSIS — Z01810 Encounter for preprocedural cardiovascular examination: Secondary | ICD-10-CM | POA: Diagnosis not present

## 2022-03-05 DIAGNOSIS — M5116 Intervertebral disc disorders with radiculopathy, lumbar region: Secondary | ICD-10-CM | POA: Diagnosis not present

## 2022-03-05 DIAGNOSIS — G4730 Sleep apnea, unspecified: Secondary | ICD-10-CM | POA: Diagnosis not present

## 2022-03-05 DIAGNOSIS — Z01818 Encounter for other preprocedural examination: Secondary | ICD-10-CM

## 2022-03-05 HISTORY — DX: Unspecified osteoarthritis, unspecified site: M19.90

## 2022-03-05 HISTORY — DX: Gout, unspecified: M10.9

## 2022-03-05 HISTORY — DX: Personal history of urinary calculi: Z87.442

## 2022-03-05 HISTORY — DX: Left bundle-branch block, unspecified: I44.7

## 2022-03-05 LAB — BASIC METABOLIC PANEL
Anion gap: 8 (ref 5–15)
BUN: 13 mg/dL (ref 8–23)
CO2: 24 mmol/L (ref 22–32)
Calcium: 9.6 mg/dL (ref 8.9–10.3)
Chloride: 106 mmol/L (ref 98–111)
Creatinine, Ser: 0.99 mg/dL (ref 0.61–1.24)
GFR, Estimated: >60 mL/min (ref >60)
Glucose, Bld: 96 mg/dL (ref 70–99)
Potassium: 3.9 mmol/L (ref 3.5–5.1)
Sodium: 138 mmol/L (ref 135–145)

## 2022-03-05 LAB — CBC
HCT: 43.3 % (ref 39.0–52.0)
Hemoglobin: 15 g/dL (ref 13.0–17.0)
MCH: 32.5 pg (ref 26.0–34.0)
MCHC: 34.6 g/dL (ref 30.0–36.0)
MCV: 93.7 fL (ref 80.0–100.0)
Platelets: 232 10*3/uL (ref 150–400)
RBC: 4.62 MIL/uL (ref 4.22–5.81)
RDW: 12.3 % (ref 11.5–15.5)
WBC: 6.9 10*3/uL (ref 4.0–10.5)
nRBC: 0 % (ref 0.0–0.2)

## 2022-03-05 NOTE — Progress Notes (Signed)
Surgical Instructions ? ? ? Your procedure is scheduled on Thursday, May 11. ? Report to [facility] Main Entrance "A" at 8:20 A.M., then check in with the Admitting office. ? Call this number if you have problems the morning of surgery: ? [phone] this is the Pre-Surgery Desk ? ? If you have any questions prior to your surgery date call [phone]: Open Monday-Friday 8am-4pm ? ? Remember: ? Do not eat or drink after midnight the night before your surgery ? ? Take these medicines the morning of surgery with A SIP OF WATER:  ? ?allopurinol (ZYLOPRIM)  ?amLODipine (NORVASC)  ?loratadine (CLARITIN  ?omeprazole (PRILOSEC)  ?pravastatin (PRAVACHOL)  ?acetaminophen (TYLENOL)  ? ? ?As of today, STOP taking any Aspirin (unless otherwise instructed by your surgeon) Aleve, Naproxen, Ibuprofen, Motrin, Advil, Goody's, BC's, all herbal medications, fish oil, and all vitamins, meloxicam (MOBIC). ? ? ?Special instructions:   ? ?Oral Hygiene is also important to reduce your risk of infection.  Remember - BRUSH YOUR TEETH THE MORNING OF SURGERY WITH YOUR REGULAR TOOTHPASTE ? ? ?[facility]- Preparing For Surgery ? ?Before surgery, you can play an important role. Because skin is not sterile, your skin needs to be as free of germs as possible. You can reduce the number of germs on your skin by washing with CHG (chlorahexidine gluconate) Soap before surgery.  CHG is an antiseptic cleaner which kills germs and bonds with the skin to continue killing germs even after washing.   ? ? ?Please do not use if you have an allergy to CHG or antibacterial soaps. If your skin becomes reddened/irritated stop using the CHG.  ?Do not shave (including legs and underarms) for at least 48 hours prior to first CHG shower. It is OK to shave your face. ? ?Please follow these instructions carefully. ?  ? ? Shower the NIGHT BEFORE SURGERY and the MORNING OF SURGERY with CHG Soap.  ? If you chose to wash your hair, wash your hair first as usual with  your normal shampoo. After you shampoo, rinse your hair and body thoroughly to remove the shampoo.  Then [employer] and genitals (private parts) with your normal soap and rinse thoroughly to remove soap. ? ?After that Use CHG Soap as you would any other liquid soap. You can apply CHG directly to the skin and wash gently with a scrungie or a clean washcloth.  ? ?Apply the CHG Soap to your body ONLY FROM THE NECK DOWN.  Do not use on open wounds or open sores. Avoid contact with your eyes, ears, mouth and genitals (private parts). Wash Face and genitals (private parts)  with your normal soap.  ? ?Wash thoroughly, paying special attention to the area where your surgery will be performed. ? ?Thoroughly rinse your body with warm water from the neck down. ? ?DO NOT shower/wash with your normal soap after using and rinsing off the CHG Soap. ? ?Pat yourself dry with a CLEAN TOWEL. ? ?Wear CLEAN PAJAMAS to bed the night before surgery ? ?Place CLEAN SHEETS on your bed the night before your surgery ? ?DO NOT SLEEP WITH PETS. ? ? ?Day of Surgery: ? ?Take a shower with CHG soap. ?Wear Clean/Comfortable clothing the morning of surgery ?Do not apply any deodorants/lotions.   ?Remember to brush your teeth WITH YOUR REGULAR TOOTHPASTE. ? ? ?Do not wear jewelry or makeup ?Do not wear lotions, powders, perfumes/colognes, or deodorant. ?Do not .  Men may shave face and neck. ?Do not bring  valuables to the hospital. ?Do not wear nail polish, gel polish, artificial nails, or any other type of covering on natural nails (fingers and toes) ?If you have artificial nails or gel coating that need to be removed by a nail salon, please have this removed prior to surgery. Artificial nails or gel coating may interfere with anesthesia's ability to adequately monitor your vital signs. ? ?[facility] is not responsible for any belongings or valuables. .  ? ?If you use a CPAP at night, you may bring your mask for your overnight stay. ?  ?Contacts,  glasses, hearing aids, dentures or partials may not be worn into surgery, please bring cases for these belongings ?  ?For patients admitted to the hospital, discharge time will be determined by your treatment team. ?  ?Patients discharged the day of surgery will not be allowed to drive home, and someone needs to stay with them for 24 hours. ? ? ?SURGICAL WAITING ROOM VISITATION ?Patients having surgery or a procedure in a hospital may have two support people. ?Children under the age of 56 must have an adult with them who is not the patient. ?They may stay in the waiting area during the procedure and may switch out with other visitors. If the patient needs to stay at the hospital during part of their recovery, the visitor guidelines for inpatient rooms apply. ? ?Please refer to the [facility] website for the visitor guidelines for Inpatients (after your surgery is over and you are in a regular room).  ? ?If you received a COVID test during your pre-op visit, it is requested that you wear a mask when out in public, stay away from anyone that may not be feeling well, and notify your surgeon if you develop symptoms. If you have been in contact with anyone that has tested positive in the last 10 days, please notify your surgeon. ? ?  ?Please read over the following fact sheets that you were given.  ? ?

## 2022-03-05 NOTE — Progress Notes (Signed)
[name] denies chest pain or shortness of breath. Patient denies having any s/s of Covid in his household.  Patient denies any known exposure to Covid.  ? ?Anthony Cisneros's PCP is Dr. Bartholome Bill. ? ?I instructed  Anthony Cisneros  to shower with antibacteria soap.   No nail polish, artificial or acrylic nails. Wear clean clothes, brush your teeth. ?Glasses, contact lens,dentures or partials may not be worn in the OR. If you need to wear them, please bring a case for glasses, do not wear contacts or bring a case, the hospital does not have contact cases, dentures or partials will have to be removed , make sure they are clean, we will provide a denture cup to put them in. You will need some one to drive you home and a responsible person over the age of 73 to stay with you for the first 24 hours after surgery.  ?

## 2022-03-06 ENCOUNTER — Encounter (HOSPITAL_COMMUNITY): Payer: Self-pay

## 2022-03-06 NOTE — Anesthesia Preprocedure Evaluation (Addendum)
Anesthesia Evaluation  ?Patient identified by MRN, date of birth, ID band ?Patient awake ? ? ? ?Reviewed: ?Allergy & Precautions, NPO status , Patient's Chart, lab work & pertinent test results ? ?History of Anesthesia Complications ?Negative for: history of anesthetic complications ? ?Airway ?Mallampati: III ? ?TM Distance: >3 FB ?Neck ROM: Full ? ? ? Dental ?no notable dental hx. ?(+) Dental Advisory Given ?  ?Pulmonary ?sleep apnea and Continuous Positive Airway Pressure Ventilation , former smoker,  ?  ?Pulmonary exam normal ? ? ? ? ? ? ? Cardiovascular ?hypertension, Pt. on medications ?Normal cardiovascular exam ? ? ?  ?Neuro/Psych ?negative neurological ROS ?   ? GI/Hepatic ?Neg liver ROS, GERD  ,  ?Endo/Other  ?negative endocrine ROS ? Renal/GU ?negative Renal ROS  ? ?  ?Musculoskeletal ?negative musculoskeletal ROS ?(+)  ? Abdominal ?  ?Peds ? Hematology ?negative hematology ROS ?(+)   ?Anesthesia Other Findings ? ? Reproductive/Obstetrics ? ?  ? ? ? ? ? ? ? ? ? ? ? ? ? ?  ?  ? ? ? ? ? ? ?Anesthesia Physical ?Anesthesia Plan ? ?ASA: 3 ? ?Anesthesia Plan: General  ? ?Post-op Pain Management: Celebrex PO (pre-op)* and Tylenol PO (pre-op)*  ? ?Induction: Intravenous ? ?PONV Risk Score and Plan: 3 and Ondansetron, Dexamethasone and Midazolam ? ?Airway Management Planned: Oral ETT ? ?Additional Equipment:  ? ?Intra-op Plan:  ? ?Post-operative Plan: Extubation in OR ? ?Informed Consent: I have reviewed the patients History and Physical, chart, labs and discussed the procedure including the risks, benefits and alternatives for the proposed anesthesia with the patient or authorized representative who has indicated his/her understanding and acceptance.  ? ? ? ?Dental advisory given ? ?Plan Discussed with: Anesthesiologist and CRNA ? ?Anesthesia Plan Comments: (See APP note by Durel Salts, FNP )  ? ? ? ? ?Anesthesia Quick Evaluation ? ?

## 2022-03-06 NOTE — Progress Notes (Signed)
Anesthesia Chart Review:   ? ? Case: [id number] Date/Time: 03/07/22 1006  ? Procedure: BILATERAL L2-3 LAMINOTOMY/FORAMINOTOMY (Bilateral) - 3C  ? Anesthesia type: General  ? Pre-op diagnosis: LUMBAR STENOSIS WITH NEUROGENIC CLAUDICATION  ? Location: [facility] OR ROOM 20 / [facility] OR  ? Surgeons: Newman Pies, MD  ? ?  ? ? ?DISCUSSION: ?Pt is 71 years old with hx LBBB, HTN, OSA ? ?EKG shows LBBB. I spoke with pt by telephone. Back in 2004 or 2005, pt needed a colonoscopy and had a pre-procedure EKG that showed a LBBB. He saw Dr. Otho Perl, a cardiologist is [facility], for LBBB, and had a treadmill stress test and then a cardiac cath. Pt reports he was told everything was "fine" and he did not need cardiology follow up or further work up. Denies active CV symptoms.  ? ?VS: BP 120/76   Pulse 63   Temp 36.9 ?C (Oral)   Resp 17   Ht '5\' 9"'$  (1.753 m)   Wt 97.1 kg   SpO2 100%   BMI 31.60 kg/m?  ? ?PROVIDERS: ?- PCP is Greig Right, MD ? ? ?LABS: Labs reviewed: Acceptable for surgery. ?- CBC and BMP both normal on 03/05/22  ? ? ?EKG 03/05/22: NSR. LAD. LBBB appears unchanged compared to EKG 01/23/07 ? ?CV: ? ?Past Medical History:  ?Diagnosis Date  ? Arthritis   ? GERD (gastroesophageal reflux disease)   ? Glaucoma   ? Gout   ? Hepatitis   ? History of kidney stones   ? Hyperlipidemia   ? Hypertension   ? LBBB (left bundle branch block)   ? Sleep apnea   ? nightly c-pap  ? Sleep apnea   ? ? ?Past Surgical History:  ?Procedure Laterality Date  ? ANKLE RECONSTRUCTION  2009  ? COLONOSCOPY    ? POLYPECTOMY    ? ROTATOR CUFF REPAIR Bilateral   ? ? ?MEDICATIONS: ? acetaminophen (TYLENOL) 500 MG tablet  ? allopurinol (ZYLOPRIM) 300 MG tablet  ? amLODipine (NORVASC) 10 MG tablet  ? amoxicillin (AMOXIL) 500 MG tablet  ? Apremilast (OTEZLA) 30 MG TABS  ? benazepril (LOTENSIN) 20 MG tablet  ? brimonidine (ALPHAGAN P) 0.1 % SOLN  ? Coenzyme Q10 (COQ10) 400 MG CAPS  ? dorzolamide-timolol (COSOPT) 22.3-6.8 MG/ML ophthalmic solution  ? latanoprost  (XALATAN) 0.005 % ophthalmic solution  ? loratadine (CLARITIN REDITABS) 10 MG dissolvable tablet  ? meloxicam (MOBIC) 15 MG tablet  ? omeprazole (PRILOSEC) 20 MG capsule  ? pravastatin (PRAVACHOL) 80 MG tablet  ? TURMERIC CURCUMIN PO  ? ?No current facility-administered medications for this encounter.  ? ? ?If no changes, I anticipate pt can proceed with surgery as scheduled.  ? ?Willeen Cass, PhD, FNP-BC ?[facility]/Anesthesiology ?Phone: [phone] ?03/06/2022 9:52 AM ? ? ? ? ? ? ? ? ?

## 2022-03-07 ENCOUNTER — Ambulatory Visit (HOSPITAL_COMMUNITY): Payer: PPO | Admitting: Emergency Medicine

## 2022-03-07 ENCOUNTER — Other Ambulatory Visit: Payer: Self-pay

## 2022-03-07 ENCOUNTER — Ambulatory Visit (HOSPITAL_COMMUNITY): Payer: PPO

## 2022-03-07 ENCOUNTER — Ambulatory Visit (HOSPITAL_COMMUNITY)
Admission: RE | Admit: 2022-03-07 | Discharge: 2022-03-08 | Disposition: A | Discharge status: Home / Self Care | Payer: PPO | Attending: Neurosurgery | Admitting: Neurosurgery

## 2022-03-07 ENCOUNTER — Encounter (HOSPITAL_COMMUNITY): Payer: Self-pay | Admitting: Neurosurgery

## 2022-03-07 ENCOUNTER — Encounter (HOSPITAL_COMMUNITY)
Admission: RE | Disposition: A | Discharge status: Home / Self Care | Payer: Self-pay | Source: Home / Self Care | Attending: Neurosurgery

## 2022-03-07 ENCOUNTER — Ambulatory Visit (HOSPITAL_BASED_OUTPATIENT_CLINIC_OR_DEPARTMENT_OTHER): Payer: PPO | Admitting: Anesthesiology

## 2022-03-07 DIAGNOSIS — M5116 Intervertebral disc disorders with radiculopathy, lumbar region: Secondary | ICD-10-CM

## 2022-03-07 DIAGNOSIS — I10 Essential (primary) hypertension: Secondary | ICD-10-CM

## 2022-03-07 DIAGNOSIS — M48062 Spinal stenosis, lumbar region with neurogenic claudication: Secondary | ICD-10-CM | POA: Diagnosis not present

## 2022-03-07 DIAGNOSIS — Z87891 Personal history of nicotine dependence: Secondary | ICD-10-CM | POA: Insufficient documentation

## 2022-03-07 DIAGNOSIS — M48061 Spinal stenosis, lumbar region without neurogenic claudication: Secondary | ICD-10-CM | POA: Diagnosis not present

## 2022-03-07 DIAGNOSIS — Z01818 Encounter for other preprocedural examination: Secondary | ICD-10-CM

## 2022-03-07 DIAGNOSIS — Z981 Arthrodesis status: Secondary | ICD-10-CM | POA: Diagnosis not present

## 2022-03-07 DIAGNOSIS — M5137 Other intervertebral disc degeneration, lumbosacral region: Secondary | ICD-10-CM | POA: Diagnosis not present

## 2022-03-07 DIAGNOSIS — G4733 Obstructive sleep apnea (adult) (pediatric): Secondary | ICD-10-CM | POA: Diagnosis not present

## 2022-03-07 DIAGNOSIS — G4730 Sleep apnea, unspecified: Secondary | ICD-10-CM | POA: Insufficient documentation

## 2022-03-07 DIAGNOSIS — Z9989 Dependence on other enabling machines and devices: Secondary | ICD-10-CM | POA: Diagnosis not present

## 2022-03-07 HISTORY — DX: Inflammatory liver disease, unspecified: K75.9

## 2022-03-07 HISTORY — PX: LUMBAR LAMINECTOMY/DECOMPRESSION MICRODISCECTOMY: SHX5026

## 2022-03-07 LAB — SURGICAL PCR SCREEN
MRSA, PCR: NEGATIVE
Staphylococcus aureus: POSITIVE — AB

## 2022-03-07 SURGERY — LUMBAR LAMINECTOMY/DECOMPRESSION MICRODISCECTOMY 1 LEVEL
Anesthesia: General | Laterality: Bilateral

## 2022-03-07 MED ORDER — DEXAMETHASONE SODIUM PHOSPHATE 10 MG/ML IJ SOLN
INTRAMUSCULAR | Status: AC
  Filled 2022-03-07: qty 2

## 2022-03-07 MED ORDER — CHLORHEXIDINE GLUCONATE CLOTH 2 % EX PADS: 6.0000 | MEDICATED_PAD | Freq: Once | CUTANEOUS | Status: DC

## 2022-03-07 MED ORDER — COQ10 400 MG PO CAPS: 400.0000 mg | ORAL_CAPSULE | Freq: Every day | ORAL | Status: DC

## 2022-03-07 MED ORDER — PROPOFOL 10 MG/ML IV BOLUS
INTRAVENOUS | Status: DC | PRN
  Administered 2022-03-07: 160 mg via INTRAVENOUS

## 2022-03-07 MED ORDER — LATANOPROST 0.005 % OP SOLN
1.0000 [drp] | Freq: Every day | OPHTHALMIC | Status: DC
  Filled 2022-03-07: qty 2.5

## 2022-03-07 MED ORDER — AMISULPRIDE (ANTIEMETIC) 5 MG/2ML IV SOLN: 10.0000 mg | Freq: Once | INTRAVENOUS | Status: DC | PRN

## 2022-03-07 MED ORDER — ALLOPURINOL 300 MG PO TABS
300.0000 mg | ORAL_TABLET | Freq: Every day | ORAL | Status: DC
  Administered 2022-03-08: 300 mg via ORAL
  Filled 2022-03-07: qty 1

## 2022-03-07 MED ORDER — CELECOXIB 200 MG PO CAPS
200.0000 mg | ORAL_CAPSULE | Freq: Once | ORAL | Status: AC
  Administered 2022-03-07: 200 mg via ORAL
  Filled 2022-03-07: qty 1

## 2022-03-07 MED ORDER — EPHEDRINE 5 MG/ML INJ
INTRAVENOUS | Status: AC
  Filled 2022-03-07: qty 5

## 2022-03-07 MED ORDER — BISACODYL 10 MG RE SUPP: 10.0000 mg | Freq: Every day | RECTAL | Status: DC | PRN

## 2022-03-07 MED ORDER — 0.9 % SODIUM CHLORIDE (POUR BTL) OPTIME
TOPICAL | Status: DC | PRN
  Administered 2022-03-07: 1000 mL

## 2022-03-07 MED ORDER — ACETAMINOPHEN 500 MG PO TABS
1000.0000 mg | ORAL_TABLET | Freq: Four times a day (QID) | ORAL | Status: AC
  Administered 2022-03-07 – 2022-03-08 (×3): 1000 mg via ORAL
  Filled 2022-03-07 (×3): qty 2

## 2022-03-07 MED ORDER — BENAZEPRIL HCL 20 MG PO TABS
20.0000 mg | ORAL_TABLET | Freq: Every day | ORAL | Status: DC
Start: 2022-03-08 — End: 2022-03-08
  Administered 2022-03-08: 20 mg via ORAL
  Filled 2022-03-07: qty 1

## 2022-03-07 MED ORDER — DIPHENHYDRAMINE HCL 50 MG/ML IJ SOLN
INTRAMUSCULAR | Status: AC
  Filled 2022-03-07: qty 1

## 2022-03-07 MED ORDER — THROMBIN 5000 UNITS EX SOLR
OROMUCOSAL | Status: DC | PRN
  Administered 2022-03-07: 5 mL via TOPICAL

## 2022-03-07 MED ORDER — LACTATED RINGERS IV SOLN: INTRAVENOUS | Status: DC

## 2022-03-07 MED ORDER — AMLODIPINE BESYLATE 5 MG PO TABS
10.0000 mg | ORAL_TABLET | Freq: Every day | ORAL | Status: DC
  Administered 2022-03-08: 10 mg via ORAL
  Filled 2022-03-07: qty 2

## 2022-03-07 MED ORDER — ACETAMINOPHEN 500 MG PO TABS
1000.0000 mg | ORAL_TABLET | Freq: Once | ORAL | Status: AC
Start: 2022-03-07 — End: 2022-03-07
  Administered 2022-03-07: 1000 mg via ORAL
  Filled 2022-03-07: qty 2

## 2022-03-07 MED ORDER — BACITRACIN ZINC 500 UNIT/GM EX OINT
TOPICAL_OINTMENT | CUTANEOUS | Status: DC | PRN
  Administered 2022-03-07: 1 via TOPICAL

## 2022-03-07 MED ORDER — PHENYLEPHRINE 80 MCG/ML (10ML) SYRINGE FOR IV PUSH (FOR BLOOD PRESSURE SUPPORT)
PREFILLED_SYRINGE | INTRAVENOUS | Status: DC | PRN
  Administered 2022-03-07: 160 ug via INTRAVENOUS
  Administered 2022-03-07 (×2): 80 ug via INTRAVENOUS

## 2022-03-07 MED ORDER — DOCUSATE SODIUM 100 MG PO CAPS
100.0000 mg | ORAL_CAPSULE | Freq: Two times a day (BID) | ORAL | Status: DC
  Administered 2022-03-07 – 2022-03-08 (×3): 100 mg via ORAL
  Filled 2022-03-07 (×3): qty 1

## 2022-03-07 MED ORDER — MIDAZOLAM HCL 2 MG/2ML IJ SOLN
INTRAMUSCULAR | Status: AC
  Filled 2022-03-07: qty 2

## 2022-03-07 MED ORDER — ACETAMINOPHEN 650 MG RE SUPP: 650.0000 mg | RECTAL | Status: DC | PRN

## 2022-03-07 MED ORDER — SODIUM CHLORIDE 0.9% FLUSH: 3.0000 mL | INTRAVENOUS | Status: DC | PRN

## 2022-03-07 MED ORDER — SUGAMMADEX SODIUM 200 MG/2ML IV SOLN
INTRAVENOUS | Status: DC | PRN
  Administered 2022-03-07: 200 mg via INTRAVENOUS

## 2022-03-07 MED ORDER — DORZOLAMIDE HCL-TIMOLOL MAL 2-0.5 % OP SOLN
1.0000 [drp] | Freq: Two times a day (BID) | OPHTHALMIC | Status: DC
  Administered 2022-03-08: 1 [drp] via OPHTHALMIC
  Filled 2022-03-07: qty 10

## 2022-03-07 MED ORDER — OXYCODONE HCL 5 MG PO TABS
5.0000 mg | ORAL_TABLET | ORAL | Status: DC | PRN
  Filled 2022-03-07: qty 1

## 2022-03-07 MED ORDER — MORPHINE SULFATE (PF) 4 MG/ML IV SOLN: 4.0000 mg | INTRAVENOUS | Status: DC | PRN

## 2022-03-07 MED ORDER — THROMBIN 5000 UNITS EX SOLR
CUTANEOUS | Status: AC
  Filled 2022-03-07: qty 5000

## 2022-03-07 MED ORDER — SODIUM CHLORIDE 0.9 % IV SOLN: 250.0000 mL | INTRAVENOUS | Status: DC

## 2022-03-07 MED ORDER — APREMILAST 30 MG PO TABS: 30.0000 mg | ORAL_TABLET | ORAL | Status: DC

## 2022-03-07 MED ORDER — CHLORHEXIDINE GLUCONATE 0.12 % MT SOLN
15.0000 mL | Freq: Once | OROMUCOSAL | Status: AC
  Administered 2022-03-07: 15 mL via OROMUCOSAL
  Filled 2022-03-07: qty 15

## 2022-03-07 MED ORDER — SODIUM CHLORIDE 0.9% FLUSH
3.0000 mL | Freq: Two times a day (BID) | INTRAVENOUS | Status: DC
  Administered 2022-03-07 (×2): 3 mL via INTRAVENOUS

## 2022-03-07 MED ORDER — BACITRACIN ZINC 500 UNIT/GM EX OINT
TOPICAL_OINTMENT | CUTANEOUS | Status: AC
  Filled 2022-03-07: qty 28.35

## 2022-03-07 MED ORDER — FENTANYL CITRATE (PF) 250 MCG/5ML IJ SOLN
INTRAMUSCULAR | Status: DC | PRN
  Administered 2022-03-07: 25 ug via INTRAVENOUS
  Administered 2022-03-07: 100 ug via INTRAVENOUS
  Administered 2022-03-07: 25 ug via INTRAVENOUS

## 2022-03-07 MED ORDER — PHENYLEPHRINE HCL-NACL 20-0.9 MG/250ML-% IV SOLN
INTRAVENOUS | Status: DC | PRN
Start: 2022-03-07 — End: 2022-03-07
  Administered 2022-03-07: 20 ug/min via INTRAVENOUS

## 2022-03-07 MED ORDER — ONDANSETRON HCL 4 MG PO TABS: 4.0000 mg | ORAL_TABLET | Freq: Four times a day (QID) | ORAL | Status: DC | PRN

## 2022-03-07 MED ORDER — CEFAZOLIN SODIUM-DEXTROSE 2-4 GM/100ML-% IV SOLN
2.0000 g | Freq: Three times a day (TID) | INTRAVENOUS | Status: AC
  Administered 2022-03-07 – 2022-03-08 (×2): 2 g via INTRAVENOUS
  Filled 2022-03-07 (×2): qty 100

## 2022-03-07 MED ORDER — PHENOL 1.4 % MT LIQD: 1.0000 | OROMUCOSAL | Status: DC | PRN

## 2022-03-07 MED ORDER — FENTANYL CITRATE (PF) 100 MCG/2ML IJ SOLN: 25.0000 ug | INTRAMUSCULAR | Status: DC | PRN

## 2022-03-07 MED ORDER — BUPIVACAINE-EPINEPHRINE (PF) 0.5% -1:200000 IJ SOLN
INTRAMUSCULAR | Status: DC | PRN
  Administered 2022-03-07 (×2): 10 mL

## 2022-03-07 MED ORDER — FENTANYL CITRATE (PF) 250 MCG/5ML IJ SOLN
INTRAMUSCULAR | Status: AC
  Filled 2022-03-07: qty 5

## 2022-03-07 MED ORDER — DEXAMETHASONE SODIUM PHOSPHATE 10 MG/ML IJ SOLN
INTRAMUSCULAR | Status: DC | PRN
  Administered 2022-03-07: 10 mg via INTRAVENOUS

## 2022-03-07 MED ORDER — CYCLOBENZAPRINE HCL 10 MG PO TABS
10.0000 mg | ORAL_TABLET | Freq: Three times a day (TID) | ORAL | Status: DC | PRN
  Administered 2022-03-07: 10 mg via ORAL
  Filled 2022-03-07: qty 1

## 2022-03-07 MED ORDER — MIDAZOLAM HCL 2 MG/2ML IJ SOLN
INTRAMUSCULAR | Status: DC | PRN
  Administered 2022-03-07: 1 mg via INTRAVENOUS

## 2022-03-07 MED ORDER — BRIMONIDINE TARTRATE 0.15 % OP SOLN
1.0000 [drp] | Freq: Three times a day (TID) | OPHTHALMIC | Status: DC
Start: 2022-03-07 — End: 2022-03-08
  Filled 2022-03-07: qty 5

## 2022-03-07 MED ORDER — ONDANSETRON HCL 4 MG/2ML IJ SOLN
INTRAMUSCULAR | Status: AC
  Filled 2022-03-07: qty 4

## 2022-03-07 MED ORDER — ACETAMINOPHEN 325 MG PO TABS: 650.0000 mg | ORAL_TABLET | ORAL | Status: DC | PRN

## 2022-03-07 MED ORDER — PROMETHAZINE HCL 25 MG/ML IJ SOLN: 6.2500 mg | INTRAMUSCULAR | Status: DC | PRN

## 2022-03-07 MED ORDER — BUPIVACAINE-EPINEPHRINE 0.5% -1:200000 IJ SOLN
INTRAMUSCULAR | Status: AC
  Filled 2022-03-07: qty 1

## 2022-03-07 MED ORDER — LIDOCAINE 2% (20 MG/ML) 5 ML SYRINGE
INTRAMUSCULAR | Status: DC | PRN
  Administered 2022-03-07: 100 mg via INTRAVENOUS

## 2022-03-07 MED ORDER — ONDANSETRON HCL 4 MG/2ML IJ SOLN
INTRAMUSCULAR | Status: DC | PRN
  Administered 2022-03-07: 4 mg via INTRAVENOUS

## 2022-03-07 MED ORDER — LIDOCAINE 2% (20 MG/ML) 5 ML SYRINGE
INTRAMUSCULAR | Status: AC
  Filled 2022-03-07: qty 10

## 2022-03-07 MED ORDER — ONDANSETRON HCL 4 MG/2ML IJ SOLN: 4.0000 mg | Freq: Four times a day (QID) | INTRAMUSCULAR | Status: DC | PRN

## 2022-03-07 MED ORDER — ORAL CARE MOUTH RINSE: 15.0000 mL | Freq: Once | OROMUCOSAL | Status: AC

## 2022-03-07 MED ORDER — MENTHOL 3 MG MT LOZG: 1.0000 | LOZENGE | OROMUCOSAL | Status: DC | PRN

## 2022-03-07 MED ORDER — PANTOPRAZOLE SODIUM 40 MG PO TBEC
40.0000 mg | DELAYED_RELEASE_TABLET | Freq: Every day | ORAL | Status: DC
  Administered 2022-03-08: 40 mg via ORAL
  Filled 2022-03-07: qty 1

## 2022-03-07 MED ORDER — ROCURONIUM BROMIDE 10 MG/ML (PF) SYRINGE
PREFILLED_SYRINGE | INTRAVENOUS | Status: AC
  Filled 2022-03-07: qty 20

## 2022-03-07 MED ORDER — MELOXICAM 7.5 MG PO TABS
15.0000 mg | ORAL_TABLET | Freq: Every day | ORAL | Status: DC
  Administered 2022-03-08: 15 mg via ORAL
  Filled 2022-03-07: qty 2

## 2022-03-07 MED ORDER — PHENYLEPHRINE 80 MCG/ML (10ML) SYRINGE FOR IV PUSH (FOR BLOOD PRESSURE SUPPORT)
PREFILLED_SYRINGE | INTRAVENOUS | Status: AC
  Filled 2022-03-07: qty 20

## 2022-03-07 MED ORDER — ROCURONIUM BROMIDE 10 MG/ML (PF) SYRINGE
PREFILLED_SYRINGE | INTRAVENOUS | Status: DC | PRN
  Administered 2022-03-07: 100 mg via INTRAVENOUS

## 2022-03-07 MED ORDER — PRAVASTATIN SODIUM 40 MG PO TABS
80.0000 mg | ORAL_TABLET | Freq: Every day | ORAL | Status: DC
  Administered 2022-03-08: 80 mg via ORAL
  Filled 2022-03-07: qty 2

## 2022-03-07 MED ORDER — OXYCODONE HCL 5 MG PO TABS: 10.0000 mg | ORAL_TABLET | ORAL | Status: DC | PRN

## 2022-03-07 MED ORDER — CEFAZOLIN SODIUM-DEXTROSE 2-4 GM/100ML-% IV SOLN
2.0000 g | INTRAVENOUS | Status: AC
  Administered 2022-03-07: 2 g via INTRAVENOUS
  Filled 2022-03-07: qty 100

## 2022-03-07 MED ORDER — LORATADINE 10 MG PO TABS
10.0000 mg | ORAL_TABLET | Freq: Every day | ORAL | Status: DC
  Administered 2022-03-08: 10 mg via ORAL
  Filled 2022-03-07: qty 1

## 2022-03-07 SURGICAL SUPPLY — 49 items
APL SKNCLS STERI-STRIP NONHPOA (GAUZE/BANDAGES/DRESSINGS) ×1
BAG COUNTER SPONGE SURGICOUNT (BAG) ×3 IMPLANT
BAG SPNG CNTER NS LX DISP (BAG) ×2
BAND INSRT 18 STRL LF DISP RB (MISCELLANEOUS) ×2
BAND RUBBER #18 3X1/16 STRL (MISCELLANEOUS) ×4 IMPLANT
BENZOIN TINCTURE PRP APPL 2/3 (GAUZE/BANDAGES/DRESSINGS) ×2 IMPLANT
BLADE CLIPPER SURG (BLADE) IMPLANT
BUR MATCHSTICK NEURO 3.0 LAGG (BURR) ×2 IMPLANT
BUR PRECISION FLUTE 6.0 (BURR) ×2 IMPLANT
CANISTER SUCT 3000ML PPV (MISCELLANEOUS) ×2 IMPLANT
CARTRIDGE OIL MAESTRO DRILL (MISCELLANEOUS) ×1 IMPLANT
DIFFUSER DRILL AIR PNEUMATIC (MISCELLANEOUS) ×2 IMPLANT
DRAPE LAPAROTOMY 100X72X124 (DRAPES) ×2 IMPLANT
DRAPE MICROSCOPE LEICA (MISCELLANEOUS) ×2 IMPLANT
DRAPE SURG 17X23 STRL (DRAPES) ×8 IMPLANT
DRSG OPSITE POSTOP 4X6 (GAUZE/BANDAGES/DRESSINGS) ×1 IMPLANT
ELECT BLADE 4.0 EZ CLEAN MEGAD (MISCELLANEOUS) ×2
ELECT REM PT RETURN 9FT ADLT (ELECTROSURGICAL) ×2
ELECTRODE BLDE 4.0 EZ CLN MEGD (MISCELLANEOUS) ×1 IMPLANT
ELECTRODE REM PT RTRN 9FT ADLT (ELECTROSURGICAL) ×1 IMPLANT
GAUZE 4X4 16PLY ~~LOC~~+RFID DBL (SPONGE) IMPLANT
GAUZE SPONGE 4X4 12PLY STRL (GAUZE/BANDAGES/DRESSINGS) ×2 IMPLANT
GLOVE BIO SURGEON STRL SZ8 (GLOVE) ×2 IMPLANT
GLOVE BIO SURGEON STRL SZ8.5 (GLOVE) ×2 IMPLANT
GLOVE EXAM NITRILE XL STR (GLOVE) IMPLANT
GOWN STRL REUS W/ TWL LRG LVL3 (GOWN DISPOSABLE) IMPLANT
GOWN STRL REUS W/ TWL XL LVL3 (GOWN DISPOSABLE) ×1 IMPLANT
GOWN STRL REUS W/TWL 2XL LVL3 (GOWN DISPOSABLE) IMPLANT
GOWN STRL REUS W/TWL LRG LVL3 (GOWN DISPOSABLE)
GOWN STRL REUS W/TWL XL LVL3 (GOWN DISPOSABLE) ×2
HEMOSTAT POWDER KIT SURGIFOAM (HEMOSTASIS) ×1 IMPLANT
KIT BASIN OR (CUSTOM PROCEDURE TRAY) ×2 IMPLANT
KIT TURNOVER KIT B (KITS) ×2 IMPLANT
NDL HYPO 21X1.5 SAFETY (NEEDLE) IMPLANT
NEEDLE HYPO 21X1.5 SAFETY (NEEDLE) IMPLANT
NEEDLE HYPO 22GX1.5 SAFETY (NEEDLE) ×2 IMPLANT
NS IRRIG 1000ML POUR BTL (IV SOLUTION) ×2 IMPLANT
OIL CARTRIDGE MAESTRO DRILL (MISCELLANEOUS) ×2
PACK LAMINECTOMY NEURO (CUSTOM PROCEDURE TRAY) ×2 IMPLANT
PAD ARMBOARD 7.5X6 YLW CONV (MISCELLANEOUS) ×6 IMPLANT
PATTIES SURGICAL .5 X1 (DISPOSABLE) IMPLANT
SPONGE SURGIFOAM ABS GEL SZ50 (HEMOSTASIS) ×1 IMPLANT
STRIP CLOSURE SKIN 1/2X4 (GAUZE/BANDAGES/DRESSINGS) ×2 IMPLANT
SUT VIC AB 1 CT1 18XBRD ANBCTR (SUTURE) ×1 IMPLANT
SUT VIC AB 1 CT1 8-18 (SUTURE) ×2
SUT VIC AB 2-0 CP2 18 (SUTURE) ×2 IMPLANT
TOWEL GREEN STERILE (TOWEL DISPOSABLE) ×2 IMPLANT
TOWEL GREEN STERILE FF (TOWEL DISPOSABLE) ×2 IMPLANT
WATER STERILE IRR 1000ML POUR (IV SOLUTION) ×2 IMPLANT

## 2022-03-07 NOTE — Anesthesia Postprocedure Evaluation (Signed)
Anesthesia Post Note ? ?Patient: Anthony Cisneros ? ?Procedure(s) Performed: BILATERAL LUMBAR TWO-THREE LAMINOTOMY/FORAMINOTOMY (Bilateral) ? ?  ? ?Patient location during evaluation: PACU ?Anesthesia Type: General ?Level of consciousness: sedated ?Pain management: pain level controlled ?Vital Signs Assessment: post-procedure vital signs reviewed and stable ?Respiratory status: spontaneous breathing and respiratory function stable ?Cardiovascular status: stable ?Postop Assessment: no apparent nausea or vomiting ?Anesthetic complications: no ? ? ?No notable events documented. ? ?Last Vitals:  ?Vitals:  ? 03/07/22 1339 03/07/22 1356  ?BP: 101/66 113/60  ?Pulse: (!) 53 (!) 56  ?Resp: 16 18  ?Temp: 36.5 ?C (!) 36.4 ?C  ?SpO2: 94% 96%  ?  ?Last Pain:  ?Vitals:  ? 03/07/22 1356  ?TempSrc: Oral  ?PainSc:   ? ? ?  ?  ?  ?  ?  ?  ? ?Camisha Srey DANIEL ? ? ? ? ?

## 2022-03-07 NOTE — Transfer of Care (Signed)
Immediate Anesthesia Transfer of Care Note ? ?Patient: Anthony Cisneros ? ?Procedure(s) Performed: BILATERAL LUMBAR TWO-THREE LAMINOTOMY/FORAMINOTOMY (Bilateral) ? ?Patient Location: PACU ? ?Anesthesia Type:General ? ?Level of Consciousness: drowsy and patient cooperative ? ?Airway & Oxygen Therapy: Patient Spontanous Breathing ? ?Post-op Assessment: Report given to RN and Post -op Vital signs reviewed and stable ? ?Post vital signs: Reviewed and stable ? ?Last Vitals:  ?Vitals Value Taken Time  ?BP 130/71 03/07/22 1225  ?Temp    ?Pulse 63 03/07/22 1226  ?Resp 17 03/07/22 1226  ?SpO2 96 % 03/07/22 1227  ?Vitals shown include unvalidated device data. ? ?Last Pain:  ?Vitals:  ? 03/07/22 0838  ?TempSrc:   ?PainSc: 2   ?   ? ?Patients Stated Pain Goal: 0 (03/07/22 9629) ? ?Complications: No notable events documented. ?

## 2022-03-07 NOTE — H&P (Signed)
Subjective: ?The patient is a 71 year old white male who has complained of back and right greater left leg pain consistent with lumbar radiculopathy/neurogenic claudication.  He has failed medical management and was worked up with a lumbar MRI which demonstrated severe spinal stenosis at L2-3.  I discussed the various treatment options with him.  He has decided proceed with surgery. ? ?Past Medical History:  ?Diagnosis Date  ? Arthritis   ? GERD (gastroesophageal reflux disease)   ? Glaucoma   ? Gout   ? Hepatitis   ? History of kidney stones   ? Hyperlipidemia   ? Hypertension   ? LBBB (left bundle branch block)   ? Sleep apnea   ? nightly c-pap  ? Sleep apnea   ?  ?Past Surgical History:  ?Procedure Laterality Date  ? ANKLE RECONSTRUCTION  2009  ? COLONOSCOPY    ? POLYPECTOMY    ? ROTATOR CUFF REPAIR Bilateral   ?  ?No Known Allergies  ?Social History  ? ?Tobacco Use  ? Smoking status: Former  ?  Packs/day: 1.50  ?  Years: 15.00  ?  Pack years: 22.50  ?  Types: Cigarettes  ?  Quit date: 70  ?  Years since quitting: 39.3  ? Smokeless tobacco: Never  ?Substance Use Topics  ? Alcohol use: Yes  ?  Alcohol/week: 14.0 standard drinks  ?  Types: 14 Cans of beer per week  ?  ?Family History  ?Problem Relation Age of Onset  ? Colon cancer Neg Hx   ? Esophageal cancer Neg Hx   ? Stomach cancer Neg Hx   ? Rectal cancer Neg Hx   ? ?Prior to Admission medications   ?Medication Sig Start Date End Date Taking? Authorizing Provider  ?acetaminophen (TYLENOL) 500 MG tablet Take 1,000 mg by mouth daily.   Yes [provider]  ?allopurinol (ZYLOPRIM) 300 MG tablet Take 300 mg by mouth daily. 06/20/20  Yes [provider]  ?amLODipine (NORVASC) 10 MG tablet Take 10 mg by mouth daily. 06/26/20  Yes [provider]  ?benazepril (LOTENSIN) 20 MG tablet Take 20 mg by mouth daily. 02/11/22  Yes [provider]  ?brimonidine (ALPHAGAN P) 0.1 % SOLN Place 1 drop into both eyes See admin instructions. 1 drop  3 times daily in the Left eye, 1 drop daily in the Right eye 12/25/20  Yes [provider]  ?Coenzyme Q10 (COQ10) 400 MG CAPS Take 400 mg by mouth daily.   Yes [provider]  ?dorzolamide-timolol (COSOPT) 22.3-6.8 MG/ML ophthalmic solution Place 1 drop into the left eye in the morning and at bedtime. 01/03/22  Yes [provider]  ?latanoprost (XALATAN) 0.005 % ophthalmic solution Place 1 drop into the left eye at bedtime. 12/08/21  Yes [provider]  ?loratadine (CLARITIN REDITABS) 10 MG dissolvable tablet Take 10 mg by mouth daily.   Yes [provider]  ?meloxicam (MOBIC) 15 MG tablet Take 15 mg by mouth daily. 01/03/22  Yes [provider]  ?omeprazole (PRILOSEC) 20 MG capsule Take 1 capsule (20 mg total) by mouth 2 (two) times daily before a meal. ?Patient taking differently: Take 20 mg by mouth daily. 08/07/20  Yes Jackquline Denmark, MD  ?pravastatin (PRAVACHOL) 80 MG tablet Take 80 mg by mouth daily. 03/17/14  Yes [provider]  ?TURMERIC CURCUMIN PO Take 2,000 mg by mouth daily.   Yes [provider]  ?amoxicillin (AMOXIL) 500 MG tablet Take 2,000 mg by mouth See admin instructions.  Take 1 hour before dental procedures    [provider]  ?Apremilast (OTEZLA) 30 MG TABS Take 30 mg by mouth every 30 (thirty) days.    [provider]  ? ?  ?Review of Systems ? ?Positive ROS: As above ? ?All other systems have been reviewed and were otherwise negative with the exception of those mentioned in the HPI and as above. ? ?Objective: ?Vital signs in last 24 hours: ?Temp:  [98.1 ?F (36.7 ?C)] 98.1 ?F (36.7 ?C) (05/11 0801) ?Pulse Rate:  [60] 60 (05/11 0801) ?Resp:  [17] 17 (05/11 0801) ?BP: (122)/(78) 122/78 (05/11 0801) ?SpO2:  [95 %] 95 % (05/11 0801) ?Weight:  [97.1 kg] 97.1 kg (05/11 0801) ?Estimated body mass index is 31.6 kg/m? as calculated from the following: ?  Height as of this encounter: '5\' 9"'$  (1.753 m). ?  Weight as of this  encounter: 97.1 kg. ? ? ?General Appearance: Alert ?Head: Normocephalic, without obvious abnormality, atraumatic ?Eyes: PERRL, conjunctiva/corneas clear, EOM's intact,    ?Ears: Normal  ?Throat: Normal  ?Neck: Supple, ?Back: unremarkable ?Lungs: Clear to auscultation bilaterally, respirations unlabored ?Heart: Regular rate and rhythm, no murmur, rub or gallop ?Abdomen: Soft, non-tender ?Extremities: Extremities normal, atraumatic, no cyanosis or edema ?Skin: unremarkable ? ?NEUROLOGIC:  ? ?Mental status: alert and oriented,Motor Exam - grossly normal ?Sensory Exam - grossly normal ?Reflexes:  ?Coordination - grossly normal ?Gait - grossly normal ?Balance - grossly normal ?Cranial Nerves: ?I: smell Not tested  ?II: visual acuity  OS: Normal  OD: Normal   ?II: visual fields Full to confrontation  ?II: pupils Equal, round, reactive to light  ?III,VII: ptosis None  ?III,IV,VI: extraocular muscles  Full ROM  ?V: mastication Normal  ?V: facial light touch sensation  Normal  ?V,VII: corneal reflex  Present  ?VII: facial muscle function - upper  Normal  ?VII: facial muscle function - lower Normal  ?VIII: hearing Not tested  ?IX: soft palate elevation  Normal  ?IX,X: gag reflex Present  ?XI: trapezius strength  5/5  ?XI: sternocleidomastoid strength 5/5  ?XI: neck flexion strength  5/5  ?XII: tongue strength  Normal  ? ? ?Data Review ?Lab Results  ?Component Value Date  ? WBC 6.9 03/05/2022  ? HGB 15.0 03/05/2022  ? HCT 43.3 03/05/2022  ? MCV 93.7 03/05/2022  ? PLT 232 03/05/2022  ? ?Lab Results  ?Component Value Date  ? NA 138 03/05/2022  ? K 3.9 03/05/2022  ? CL 106 03/05/2022  ? CO2 24 03/05/2022  ? BUN 13 03/05/2022  ? CREATININE 0.99 03/05/2022  ? GLUCOSE 96 03/05/2022  ? ?No results found for: INR, PROTIME ? ?Assessment/Plan: ?L2-3 spinal stenosis, lumbago, lumbar radiculopathy, neurogenic claudication: I have discussed the situation with the patient.  I have reviewed his imaging studies with him and pointed out the  abnormalities.  We have discussed the various treatment options including surgery.  I have described the surgical treatment option of an L2-3 bilateral laminotomy/foraminotomy.  I have shown him surgical models.  I have given him a surgical pamphlet.  We have discussed the risk, benefits, alternatives, expected postop course, and likelihood of achieving our goals with surgery.  I have answered all his questions.  He has decided to proceed with surgery. ? ? ?Ophelia Charter ?03/07/2022 9:25 AM ? ?  ? ?

## 2022-03-07 NOTE — Progress Notes (Signed)
Patient's wedding band taken to the lobby and given to his wife Torian,[place]. ?

## 2022-03-07 NOTE — Op Note (Signed)
Brief history: The patient is a 71 year old white male who is complained of back and bilateral leg pain consistent with neurogenic claudication/lumbar radiculopathy.  He was worked up with a lumbar MRI which demonstrated herniated disc and lumbar spinal stenosis at L2-3.  I discussed the various treatment options with him.  He has decided proceed with surgery. ? ?Preoperative diagnosis: L2-3 spinal stenosis, herniated disc, lumbar radiculopathy, lumbago, neurogenic claudication ? ?Postoperative diagnosis: The same ? ?Procedure: Bilateral L2-3 laminotomy foraminotomies and intervertebral discectomy using micro-dissection ? ?Surgeon: Dr. Earle Gell ? ?Asst.: Dr. Ashok Pall and Arnetha Massy NP ? ?Anesthesia: Gen. endotracheal ? ?Estimated blood loss: 75 cc ? ?Drains: None ? ?Complications: None ? ?Description of procedure: The patient was brought to the operating room by the anesthesia team. General endotracheal anesthesia was induced. The patient was turned to the prone position on the Wilson frame. The patient's lumbosacral region was then prepared with Betadine scrub and Betadine solution. Sterile drapes were applied. ? ?I then injected the area to be incised with Marcaine with epinephrine solution. I then used a scalpel to make a linear midline incision over the L2-3 intervertebral disc space. I then used electrocautery to perform a left sided subperiosteal dissection exposing the spinous process and lamina of L2 and L3. We obtained intraoperative radiograph to confirm our location. I then inserted the [facility] retractor for exposure. ? ?We then brought the operative microscope into the field. Under its magnification and illumination we completed the microdissection. I used a high-speed drill to perform a laminotomy at L2-3 on the left. I then used a Kerrison punches to widen the laminotomy and removed the ligamentum flavum at L2-3 on the left. We then used microdissection to free up the thecal sac and the  left L3 nerve root from the epidural tissue. I then used a Kerrison punch to perform a foraminotomy at about the left L3 nerve root. We then using the nerve root retractor to gently retract the thecal sac and the L3 nerve root medially. This exposed the intervertebral disc. We identified the ruptured disc and remove it with the pituitary forceps.  I then drilled across the midline and used the Kerrison punches to remove the right L2-3 ligamentum flavum and medial facet.  We performed a foraminotomy about the right L3 nerve root.  We encountered a free fragment herniated disc on the right side as well.  We removed it with a pituitary forceps decompressing the thecal sac. ? ?I then palpated along the ventral surface of the thecal sac and along exit route of the bilateral L3 nerve root and noted that the neural structures were well decompressed. This completed the decompression. ? ?We then obtained hemostasis using bipolar electrocautery. We irrigated the wound out with bacitracin solution. We then removed the retractor. We then reapproximated the patient's thoracolumbar fascia with interrupted #1 Vicryl suture. We then reapproximated the patient's subcutaneous tissue with interrupted 2-0 Vicryl suture. We then reapproximated patient's skin with Steri-Strips and benzoin. The was then coated with bacitracin ointment. The drapes were removed. The patient was subsequently returned to the supine position where they were extubated by the anesthesia team. The patient was then transported to the postanesthesia care unit in stable condition. All sponge instrument and needle counts were reportedly correct at the end of this case. ?  ?

## 2022-03-08 ENCOUNTER — Encounter (HOSPITAL_COMMUNITY): Payer: Self-pay | Admitting: Neurosurgery

## 2022-03-08 DIAGNOSIS — M48062 Spinal stenosis, lumbar region with neurogenic claudication: Secondary | ICD-10-CM | POA: Diagnosis not present

## 2022-03-08 MED ORDER — CYCLOBENZAPRINE HCL 5 MG PO TABS: 5.0000 mg | ORAL_TABLET | Freq: Three times a day (TID) | ORAL | 0 refills | Status: AC | PRN

## 2022-03-08 MED ORDER — DOCUSATE SODIUM 100 MG PO CAPS: 100.0000 mg | ORAL_CAPSULE | Freq: Two times a day (BID) | ORAL | 0 refills | Status: AC

## 2022-03-08 MED ORDER — OXYCODONE-ACETAMINOPHEN 5-325 MG PO TABS: 1.0000 | ORAL_TABLET | ORAL | Status: DC | PRN

## 2022-03-08 MED ORDER — OXYCODONE-ACETAMINOPHEN 5-325 MG PO TABS
1.0000 | ORAL_TABLET | ORAL | 0 refills | Status: AC | PRN
Start: 2022-03-08 — End: ?

## 2022-03-08 MED ORDER — CYCLOBENZAPRINE HCL 5 MG PO TABS: 5.0000 mg | ORAL_TABLET | Freq: Three times a day (TID) | ORAL | Status: DC | PRN

## 2022-03-08 NOTE — Discharge Summary (Signed)
Physician Discharge Summary  ?Patient ID: ?Anthony Cisneros ?MRN: [id number] ?DOB/AGE: 71/20/1952 71 y.o. ? ?Admit date: 03/07/2022 ?Discharge date: 03/08/2022 ? ?Admission Diagnoses: Lumbago, lumbar radiculopathy, lumbar spinal stenosis, neurogenic claudication ? ?Discharge Diagnoses: The same ?Principal Problem: ?  Spinal stenosis of lumbar region with neurogenic claudication ? ? ?Discharged Condition: good ? ?Hospital Course: I performed bilateral L2-3 laminotomy foraminotomies and discectomy on the patient on 03/07/2022.  The surgery went well. ? ?The patient's postoperative course was unremarkable.  On postoperative day #1 he requested discharge home.  He was given verbal and written discharge instructions.  All his questions were answered. ? ?Consults: PT, OT, care management ?Significant Diagnostic Studies: None ?Treatments: Bilateral L2-3 laminotomy/foraminotomies and discectomy using microdissection ?Discharge Exam: ?Blood pressure 119/70, pulse 64, temperature 98 ?F (36.7 ?C), temperature source Oral, resp. rate 20, height '5\' 9"'$  (1.753 m), weight 97.1 kg, SpO2 92 %. ?The patient is alert and pleasant.  He looks well.  His strength is normal.  His dressing is clean dry. ? ?Disposition: Home ? ?Discharge Instructions   ? ? Call MD for:  difficulty breathing, headache or visual disturbances   Complete by: As directed ?  ? Call MD for:  extreme fatigue   Complete by: As directed ?  ? Call MD for:  hives   Complete by: As directed ?  ? Call MD for:  persistant dizziness or light-headedness   Complete by: As directed ?  ? Call MD for:  persistant nausea and vomiting   Complete by: As directed ?  ? Call MD for:  redness, tenderness, or signs of infection (pain, swelling, redness, odor or green/yellow discharge around incision site)   Complete by: As directed ?  ? Call MD for:  severe uncontrolled pain   Complete by: As directed ?  ? Call MD for:  temperature >100.4   Complete by: As directed ?  ? Diet - low sodium heart  healthy   Complete by: As directed ?  ? Discharge instructions   Complete by: As directed ?  ? Call [phone] for a followup appointment. Take a stool softener while you are using pain medications.  ? Driving Restrictions   Complete by: As directed ?  ? Do not drive for 2 weeks.  ? Increase activity slowly   Complete by: As directed ?  ? Lifting restrictions   Complete by: As directed ?  ? Do not lift more than 5 pounds. No excessive bending or twisting.  ? May shower / Bathe   Complete by: As directed ?  ? Remove the dressing for 3 days after surgery.  You may shower, but leave the incision alone.  ? Remove dressing in 48 hours   Complete by: As directed ?  ? ?  ? ?Allergies as of 03/08/2022   ?No Known Allergies ?  ? ?  ?Medication List  ?  ? ?TAKE these medications   ? ?acetaminophen 500 MG tablet ?Commonly known as: TYLENOL ?Take 1,000 mg by mouth daily. ?  ?allopurinol 300 MG tablet ?Commonly known as: ZYLOPRIM ?Take 300 mg by mouth daily. ?  ?Alphagan P 0.1 % Soln ?Generic drug: brimonidine ?Place 1 drop into both eyes See admin instructions. 1 drop 3 times daily in the Left eye, 1 drop daily in the Right eye ?  ?amLODipine 10 MG tablet ?Commonly known as: NORVASC ?Take 10 mg by mouth daily. ?  ?amoxicillin 500 MG tablet ?Commonly known as: AMOXIL ?Take 2,000 mg by mouth See admin  instructions. Take 1 hour before dental procedures ?  ?benazepril 20 MG tablet ?Commonly known as: LOTENSIN ?Take 20 mg by mouth daily. ?  ?CoQ10 400 MG Caps ?Take 400 mg by mouth daily. ?  ?cyclobenzaprine 5 MG tablet ?Commonly known as: FLEXERIL ?Take 1 tablet (5 mg total) by mouth 3 (three) times daily as needed for muscle spasms. ?  ?docusate sodium 100 MG capsule ?Commonly known as: COLACE ?Take 1 capsule (100 mg total) by mouth 2 (two) times daily. ?  ?dorzolamide-timolol 22.3-6.8 MG/ML ophthalmic solution ?Commonly known as: COSOPT ?Place 1 drop into the left eye in the morning and at bedtime. ?  ?latanoprost 0.005 %  ophthalmic solution ?Commonly known as: XALATAN ?Place 1 drop into the left eye at bedtime. ?  ?loratadine 10 MG dissolvable tablet ?Commonly known as: CLARITIN REDITABS ?Take 10 mg by mouth daily. ?  ?meloxicam 15 MG tablet ?Commonly known as: MOBIC ?Take 15 mg by mouth daily. ?  ?omeprazole 20 MG capsule ?Commonly known as: PRILOSEC ?Take 1 capsule (20 mg total) by mouth 2 (two) times daily before a meal. ?What changed: when to take this ?  ?Otezla 30 MG Tabs ?Generic drug: Apremilast ?Take 30 mg by mouth every 30 (thirty) days. ?  ?oxyCODONE-acetaminophen 5-325 MG tablet ?Commonly known as: PERCOCET/ROXICET ?Take 1-2 tablets by mouth every 4 (four) hours as needed for moderate pain. ?  ?pravastatin 80 MG tablet ?Commonly known as: PRAVACHOL ?Take 80 mg by mouth daily. ?  ?TURMERIC CURCUMIN PO ?Take 2,000 mg by mouth daily. ?  ? ?  ? ? ? ?Signed: ?Ophelia Charter ?03/08/2022, 6:56 AM ? ? ? ? ?

## 2022-03-08 NOTE — Progress Notes (Signed)
OT Cancellation Note ? ?Patient Details ?Name: Anthony Cisneros ?MRN: [id number] ?DOB: [birth year] ? ? ?Cancelled Treatment:    Reason Eval/Treat Not Completed: OT screened, no needs identified, will sign off (Pt performing mobility near baseline with PT. Reporting he feels comfortable with ADLs and able to demonstrate compensatory techniques.) ? ?Greenlee Ancheta M Davinity Fanara ?Natsha Guidry MSOT, OTR/L ?Acute Rehab ?Pager: [phone] ?Office: [phone] ?03/08/2022, 8:05 AM ?

## 2022-03-08 NOTE — Progress Notes (Signed)
Patient awaiting transport to his vehicle for discharge home; in no acute distress nor complaints of pain nor discomfort; moves all extremities well; incision on his back with honeycomb dressing and is clean, dry and intact; room was checked for all his belongings; discharge instructions concerning medications, incision care, follow up appointment and when to call the doctor as needed were all discussed to patient by RN and he expressed understanding on the instructions given. ?

## 2022-03-08 NOTE — Plan of Care (Signed)
?  Problem: Education: Goal: Ability to verbalize activity precautions or restrictions will improve Outcome: Completed/Met Goal: Knowledge of the prescribed therapeutic regimen will improve Outcome: Completed/Met Goal: Understanding of discharge needs will improve Outcome: Completed/Met   Problem: Activity: Goal: Ability to avoid complications of mobility impairment will improve Outcome: Completed/Met Goal: Ability to tolerate increased activity will improve Outcome: Completed/Met Goal: Will remain free from falls Outcome: Completed/Met   Problem: Bowel/Gastric: Goal: Gastrointestinal status for postoperative course will improve Outcome: Completed/Met   Problem: Clinical Measurements: Goal: Ability to maintain clinical measurements within normal limits will improve Outcome: Completed/Met Goal: Postoperative complications will be avoided or minimized Outcome: Completed/Met Goal: Diagnostic test results will improve Outcome: Completed/Met   Problem: Pain Management: Goal: Pain level will decrease Outcome: Completed/Met   Problem: Skin Integrity: Goal: Will show signs of wound healing Outcome: Completed/Met   Problem: Health Behavior/Discharge Planning: Goal: Identification of resources available to assist in meeting health care needs will improve Outcome: Completed/Met   Problem: Bladder/Genitourinary: Goal: Urinary functional status for postoperative course will improve Outcome: Completed/Met   

## 2022-03-08 NOTE — Evaluation (Signed)
Physical Therapy Evaluation & Discharge ?Patient Details ?Name: Anthony Cisneros ?MRN: [id number] ?DOB: February 02, 1951 ?Today's Date: 03/08/2022 ? ?History of Present Illness ? Pt is a 71 y.o. male s/p elective bilateral L2-3 laminotomy foraminotomies and intervertebral discectomy on 03/07/22. PMH includes HTN, gout, hepatitis, glaucoma, arthritis. ?  ?Clinical Impression ? Patient evaluated by Physical Therapy with no further acute PT needs identified. PTA, pt independent, lives with wife, works as Administrator. Today, pt indep with mobility and ADL tasks. All education has been completed and the patient has no further questions. Acute PT is signing off. Thank you for this referral.    ? ?Recommendations for follow up therapy are one component of a multi-disciplinary discharge planning process, led by the attending physician.  Recommendations may be updated based on patient status, additional functional criteria and insurance authorization. ? ?Follow Up Recommendations No PT follow up ? ?  ?Assistance Recommended at Discharge PRN  ?Patient can return home with the following ? Assistance with cooking/housework ? ?  ?Equipment Recommendations None recommended by PT  ?Recommendations for Other Services ?    ?  ?Functional Status Assessment    ? ?  ?Precautions / Restrictions Precautions ?Precautions: Back ?Precaution Booklet Issued: Yes (comment) ?Precaution Comments: pt able to recall 3/3 back precautions at beginning of session ?Required Braces or Orthoses:  (no brace) ?Restrictions ?Weight Bearing Restrictions: No  ? ?  ? ?Mobility ? Bed Mobility ?Overal bed mobility: Modified Independent ?  ?  ?  ?  ?  ?  ?General bed mobility comments: mod indep with log roll with HOB slightly elevated; pt reports his bed at home will elevate HOB ?  ? ?Transfers ?Overall transfer level: Independent ?Equipment used: None ?  ?  ?  ?  ?  ?  ?  ?  ?  ? ?Ambulation/Gait ?Ambulation/Gait assistance: Independent ?Gait Distance (Feet): 400  Feet ?Assistive device: None ?Gait Pattern/deviations: Step-through pattern, Decreased stride length ?Gait velocity: Decreased ?  ?  ?General Gait Details: slow, guarded gait independent without DME; no overt instability or LOB; 1x cues to not twist with ambulation ? ?Stairs ?Stairs: Yes ?Stairs assistance: Modified independent (Device/Increase time) ?Stair Management: One rail Right, Alternating pattern, Forwards ?Number of Stairs: 11 ?General stair comments: Mod indep ascend/descending stairs with rail support ? ?Wheelchair Mobility ?  ? ?Modified Rankin (Stroke Patients Only) ?  ? ?  ? ?Balance Overall balance assessment: No apparent balance deficits (not formally assessed) ?  ?Sitting balance-Leahy Scale: Good ?Sitting balance - Comments: able to don/doff bilateral socks with appropriate compensatory strategies (educ on figure four technique to limit bending) ?  ?Standing balance support: No upper extremity supported, During functional activity ?Standing balance-Leahy Scale: Good ?  ?  ?  ?  ?  ?  ?  ?  ?  ?  ?  ?  ?   ? ? ? ?Pertinent Vitals/Pain Pain Assessment ?Pain Assessment: Faces ?Faces Pain Scale: Hurts a little bit ?Pain Location: lumbar incision ?Pain Descriptors / Indicators: Sore, Operative site guarding ?Pain Intervention(s): Monitored during session, Limited activity within patient's tolerance  ? ? ?Home Living Family/patient expects to be discharged to:: Private residence ?Living Arrangements: Spouse/significant other ?Available Help at Discharge: Family;Available 24 hours/day ?Type of Home: House ?Home Access: Stairs to enter ?Entrance Stairs-Rails: Right;Left ?Entrance Stairs-Number of Steps: 2 ?  ?Home Layout: One level ?Home Equipment: Shower seat;Grab bars - tub/shower;Grab bars - toilet ?Additional Comments: Lives with wife who is retired  ?  ?Prior Function  Prior Level of Function : Independent/Modified Independent;Working/employed;Driving ?  ?  ?  ?  ?  ?  ?Mobility Comments: Independent  without DME; works driving [id number] ?  ?  ? ? ?Hand Dominance  ?   ? ?  ?Extremity/Trunk Assessment  ? Upper Extremity Assessment ?Upper Extremity Assessment: Overall WFL for tasks assessed ?  ? ?Lower Extremity Assessment ?Lower Extremity Assessment: Overall WFL for tasks assessed ?  ? ?Cervical / Trunk Assessment ?Cervical / Trunk Assessment: Back Surgery  ?Communication  ? Communication: No difficulties  ?Cognition Arousal/Alertness: Awake/alert ?Behavior During Therapy: [facility] for tasks assessed/performed ?Overall Cognitive Status: Within Functional Limits for tasks assessed ?  ?  ?  ?  ?  ?  ?  ?  ?  ?  ?  ?  ?  ?  ?  ?  ?  ?  ?  ? ?  ?General Comments General comments (skin integrity, edema, etc.): educ re: precautions, positioning, activity recommendations, home modifications, compensatory strategies for ADL tasks, fall risk reduction, importance of mobility ? ?  ?Exercises    ? ?Assessment/Plan  ?  ?PT Assessment Patient does not need any further PT services  ?PT Problem List   ? ?   ?  ?PT Treatment Interventions     ? ?PT Goals (Current goals can be found in the Care Plan section)  ?Acute Rehab PT Goals ?PT Goal Formulation: All assessment and education complete, DC therapy ? ?  ?Frequency   ?  ? ? ?Co-evaluation   ?  ?  ?  ?  ? ? ?  ?AM-PAC PT "6 Clicks" Mobility  ?Outcome Measure Help needed turning from your back to your side while in a flat bed without using bedrails?: None ?Help needed moving from lying on your back to sitting on the side of a flat bed without using bedrails?: None ?Help needed moving to and from a bed to a chair (including a wheelchair)?: None ?Help needed standing up from a chair using your arms (e.g., wheelchair or bedside chair)?: None ?Help needed to walk in hospital room?: None ?Help needed climbing 3-5 steps with a railing? : None ?6 Click Score: 24 ? ?  ?End of Session   ?Activity Tolerance: Patient tolerated treatment well ?Patient left: in bed;with call bell/phone within  reach ?Nurse Communication: Mobility status ?PT Visit Diagnosis: Other abnormalities of gait and mobility (R26.89) ?  ? ?Time: 6384-6659 ?PT Time Calculation (min) (ACUTE ONLY): 13 min ? ? ?Charges:   PT Evaluation ?$PT Eval Low Complexity: 1 Low ?  ?  ?Mabeline Caras, PT, DPT ?Acute Rehabilitation Services  ?Pager [phone] ?Office [phone] ? ?Derry Lory ?03/08/2022, 8:15 AM ? ?

## 2022-04-08 DIAGNOSIS — J0100 Acute maxillary sinusitis, unspecified: Secondary | ICD-10-CM | POA: Diagnosis not present

## 2022-04-22 DIAGNOSIS — M1000 Idiopathic gout, unspecified site: Secondary | ICD-10-CM | POA: Diagnosis not present

## 2022-04-22 DIAGNOSIS — G4730 Sleep apnea, unspecified: Secondary | ICD-10-CM | POA: Diagnosis not present

## 2022-04-22 DIAGNOSIS — Z79899 Other long term (current) drug therapy: Secondary | ICD-10-CM | POA: Diagnosis not present

## 2022-04-22 DIAGNOSIS — E7800 Pure hypercholesterolemia, unspecified: Secondary | ICD-10-CM | POA: Diagnosis not present

## 2022-04-22 DIAGNOSIS — E669 Obesity, unspecified: Secondary | ICD-10-CM | POA: Diagnosis not present

## 2022-04-22 DIAGNOSIS — L409 Psoriasis, unspecified: Secondary | ICD-10-CM | POA: Diagnosis not present

## 2022-04-22 DIAGNOSIS — I10 Essential (primary) hypertension: Secondary | ICD-10-CM | POA: Diagnosis not present

## 2022-04-22 DIAGNOSIS — Z6832 Body mass index (BMI) 32.0-32.9, adult: Secondary | ICD-10-CM | POA: Diagnosis not present

## 2022-04-23 DIAGNOSIS — M1000 Idiopathic gout, unspecified site: Secondary | ICD-10-CM | POA: Diagnosis not present

## 2022-04-23 DIAGNOSIS — E7800 Pure hypercholesterolemia, unspecified: Secondary | ICD-10-CM | POA: Diagnosis not present

## 2022-04-23 DIAGNOSIS — I10 Essential (primary) hypertension: Secondary | ICD-10-CM | POA: Diagnosis not present

## 2022-04-29 DIAGNOSIS — H401113 Primary open-angle glaucoma, right eye, severe stage: Secondary | ICD-10-CM | POA: Diagnosis not present

## 2022-04-29 DIAGNOSIS — H401123 Primary open-angle glaucoma, left eye, severe stage: Secondary | ICD-10-CM | POA: Diagnosis not present

## 2022-05-23 DIAGNOSIS — H401113 Primary open-angle glaucoma, right eye, severe stage: Secondary | ICD-10-CM | POA: Diagnosis not present

## 2022-05-23 DIAGNOSIS — H401123 Primary open-angle glaucoma, left eye, severe stage: Secondary | ICD-10-CM | POA: Diagnosis not present

## 2022-07-02 DIAGNOSIS — Z6831 Body mass index (BMI) 31.0-31.9, adult: Secondary | ICD-10-CM | POA: Diagnosis not present

## 2022-07-02 DIAGNOSIS — M48062 Spinal stenosis, lumbar region with neurogenic claudication: Secondary | ICD-10-CM | POA: Diagnosis not present

## 2022-07-02 IMAGING — CR DG LUMBAR SPINE 2-3V
2 series · 2 of 2 positions shown · non-contrast
Comparison: Lumbar spine radiographs 10/08/2021

CLINICAL DATA: Localization images for bilateral L2-3
laminotomy/foraminotomy.

EXAM:
LUMBAR SPINE - 2-3 VIEW

[lateral (1 of 2)]
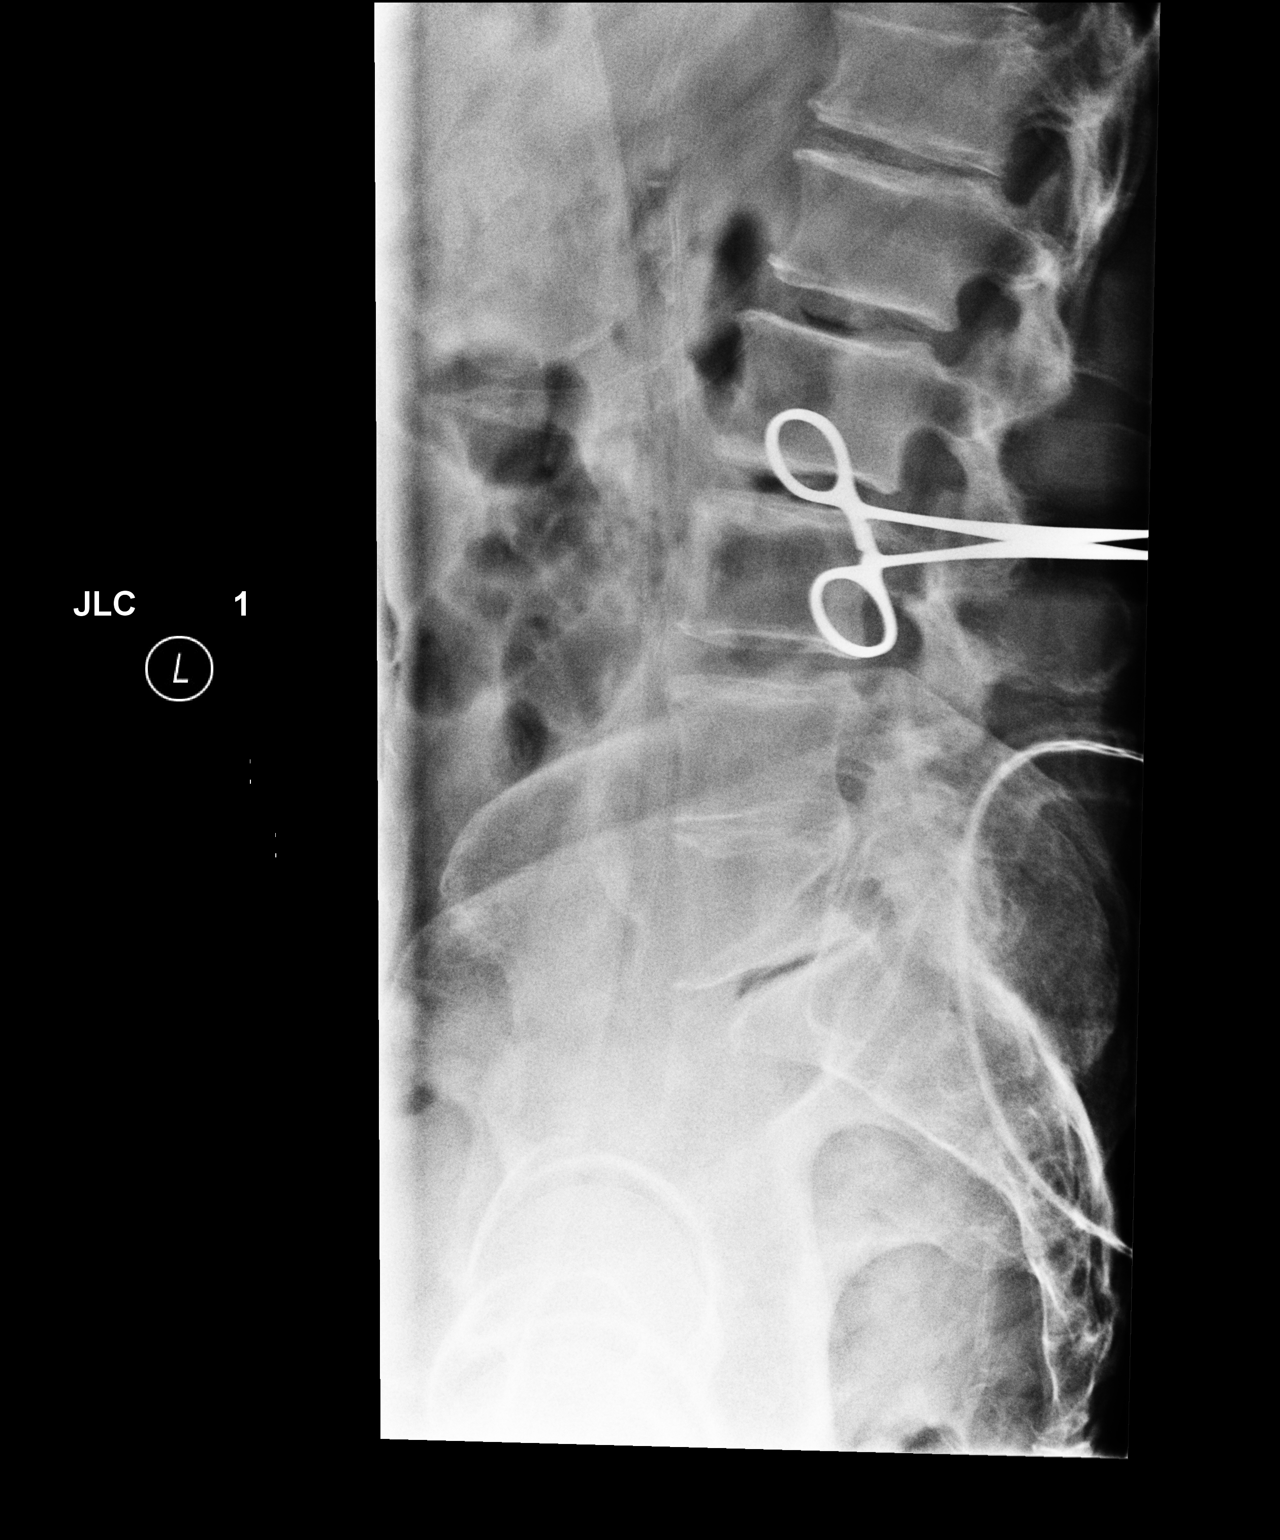

[lateral (2 of 2)]
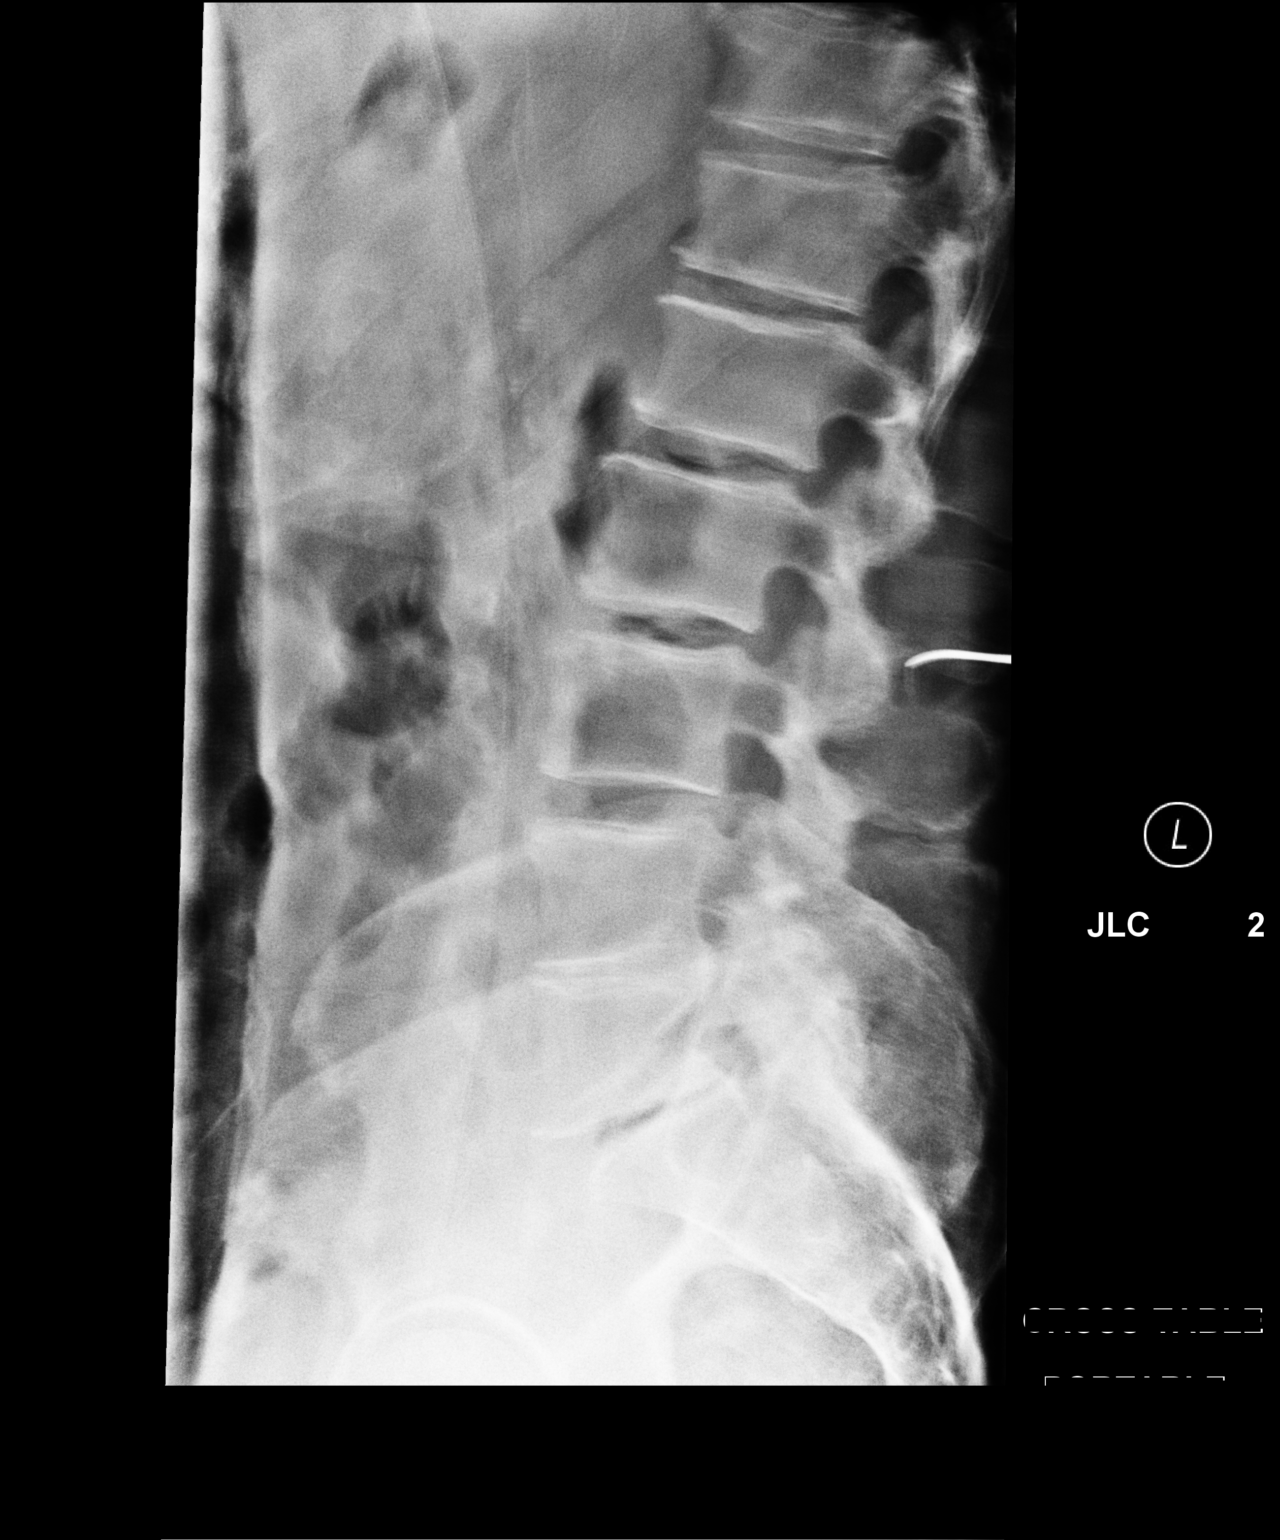

[2 of 2 positions shown; findings below may reference images not displayed]

FINDINGS: There is partial fusion of the L4-5 disc level. Severe L5-S1 disc
space narrowing with degenerative vacuum phenomenon.

On the first image, the handle of surgical forceps overlies the
entire height of the L3 vertebral body in the inferior aspect of the
L2 vertebral body.

On the second image, the tip of a metallic surgical instrument from
a posterior approach overlies the posterior aspect of the L2-3 facet
joint.
IMPRESSION: Surgical instrumentation for localization, as above.

## 2022-07-22 DIAGNOSIS — Z23 Encounter for immunization: Secondary | ICD-10-CM | POA: Diagnosis not present

## 2022-07-22 DIAGNOSIS — G4730 Sleep apnea, unspecified: Secondary | ICD-10-CM | POA: Diagnosis not present

## 2022-07-22 DIAGNOSIS — I10 Essential (primary) hypertension: Secondary | ICD-10-CM | POA: Diagnosis not present

## 2022-07-22 DIAGNOSIS — E7800 Pure hypercholesterolemia, unspecified: Secondary | ICD-10-CM | POA: Diagnosis not present

## 2022-07-22 DIAGNOSIS — Z79899 Other long term (current) drug therapy: Secondary | ICD-10-CM | POA: Diagnosis not present

## 2022-07-22 DIAGNOSIS — M1000 Idiopathic gout, unspecified site: Secondary | ICD-10-CM | POA: Diagnosis not present

## 2022-08-28 DIAGNOSIS — M7989 Other specified soft tissue disorders: Secondary | ICD-10-CM | POA: Diagnosis not present

## 2022-08-28 DIAGNOSIS — M25562 Pain in left knee: Secondary | ICD-10-CM | POA: Diagnosis not present

## 2022-08-28 DIAGNOSIS — Q741 Congenital malformation of knee: Secondary | ICD-10-CM | POA: Diagnosis not present

## 2022-09-09 DIAGNOSIS — H401113 Primary open-angle glaucoma, right eye, severe stage: Secondary | ICD-10-CM | POA: Diagnosis not present

## 2022-09-09 DIAGNOSIS — H401123 Primary open-angle glaucoma, left eye, severe stage: Secondary | ICD-10-CM | POA: Diagnosis not present

## 2022-09-26 DIAGNOSIS — M79671 Pain in right foot: Secondary | ICD-10-CM | POA: Diagnosis not present

## 2022-09-26 DIAGNOSIS — M25571 Pain in right ankle and joints of right foot: Secondary | ICD-10-CM | POA: Diagnosis not present

## 2022-09-26 DIAGNOSIS — M19071 Primary osteoarthritis, right ankle and foot: Secondary | ICD-10-CM | POA: Diagnosis not present

## 2022-09-26 DIAGNOSIS — G8929 Other chronic pain: Secondary | ICD-10-CM | POA: Diagnosis not present

## 2022-10-17 DIAGNOSIS — E669 Obesity, unspecified: Secondary | ICD-10-CM | POA: Diagnosis not present

## 2022-10-17 DIAGNOSIS — I10 Essential (primary) hypertension: Secondary | ICD-10-CM | POA: Diagnosis not present

## 2022-10-17 DIAGNOSIS — Z0000 Encounter for general adult medical examination without abnormal findings: Secondary | ICD-10-CM | POA: Diagnosis not present

## 2022-10-17 DIAGNOSIS — G4730 Sleep apnea, unspecified: Secondary | ICD-10-CM | POA: Diagnosis not present

## 2022-10-17 DIAGNOSIS — M1000 Idiopathic gout, unspecified site: Secondary | ICD-10-CM | POA: Diagnosis not present

## 2022-10-17 DIAGNOSIS — L409 Psoriasis, unspecified: Secondary | ICD-10-CM | POA: Diagnosis not present

## 2022-10-17 DIAGNOSIS — Z125 Encounter for screening for malignant neoplasm of prostate: Secondary | ICD-10-CM | POA: Diagnosis not present

## 2022-10-17 DIAGNOSIS — Z1389 Encounter for screening for other disorder: Secondary | ICD-10-CM | POA: Diagnosis not present

## 2022-10-17 DIAGNOSIS — Z79899 Other long term (current) drug therapy: Secondary | ICD-10-CM | POA: Diagnosis not present

## 2022-10-17 DIAGNOSIS — Z6833 Body mass index (BMI) 33.0-33.9, adult: Secondary | ICD-10-CM | POA: Diagnosis not present

## 2022-10-17 DIAGNOSIS — E7800 Pure hypercholesterolemia, unspecified: Secondary | ICD-10-CM | POA: Diagnosis not present

## 2023-06-02 ENCOUNTER — Encounter: Payer: Self-pay | Admitting: Gastroenterology

## 2023-07-17 ENCOUNTER — Ambulatory Visit (AMBULATORY_SURGERY_CENTER): Payer: Medicare PPO

## 2023-07-17 VITALS — Ht 69.0 in | Wt 215.0 lb

## 2023-07-17 DIAGNOSIS — D131 Benign neoplasm of stomach: Secondary | ICD-10-CM

## 2023-07-17 DIAGNOSIS — K2270 Barrett's esophagus without dysplasia: Secondary | ICD-10-CM

## 2023-07-17 MED ORDER — SUTAB 1479-225-188 MG PO TABS: 12.0000 | ORAL_TABLET | ORAL | 0 refills | Status: DC

## 2023-07-17 NOTE — Progress Notes (Signed)

## 2023-07-21 DIAGNOSIS — I10 Essential (primary) hypertension: Secondary | ICD-10-CM | POA: Diagnosis not present

## 2023-07-21 DIAGNOSIS — M1000 Idiopathic gout, unspecified site: Secondary | ICD-10-CM | POA: Diagnosis not present

## 2023-07-21 DIAGNOSIS — K219 Gastro-esophageal reflux disease without esophagitis: Secondary | ICD-10-CM | POA: Diagnosis not present

## 2023-07-21 DIAGNOSIS — Z6833 Body mass index (BMI) 33.0-33.9, adult: Secondary | ICD-10-CM | POA: Diagnosis not present

## 2023-07-21 DIAGNOSIS — G4730 Sleep apnea, unspecified: Secondary | ICD-10-CM | POA: Diagnosis not present

## 2023-07-21 DIAGNOSIS — Z23 Encounter for immunization: Secondary | ICD-10-CM | POA: Diagnosis not present

## 2023-07-21 DIAGNOSIS — E669 Obesity, unspecified: Secondary | ICD-10-CM | POA: Diagnosis not present

## 2023-07-21 DIAGNOSIS — E7800 Pure hypercholesterolemia, unspecified: Secondary | ICD-10-CM | POA: Diagnosis not present

## 2023-07-21 DIAGNOSIS — Z79899 Other long term (current) drug therapy: Secondary | ICD-10-CM | POA: Diagnosis not present

## 2023-07-29 ENCOUNTER — Encounter: Payer: Self-pay | Admitting: Gastroenterology

## 2023-07-29 ENCOUNTER — Telehealth: Payer: Self-pay | Admitting: Gastroenterology

## 2023-07-29 ENCOUNTER — Other Ambulatory Visit: Payer: Self-pay

## 2023-07-29 DIAGNOSIS — D131 Benign neoplasm of stomach: Secondary | ICD-10-CM

## 2023-07-29 DIAGNOSIS — K2270 Barrett's esophagus without dysplasia: Secondary | ICD-10-CM

## 2023-07-29 MED ORDER — PEG 3350-KCL-NA BICARB-NACL 420 G PO SOLR: 4000.0000 mL | Freq: Once | ORAL | 0 refills | Status: AC

## 2023-07-29 NOTE — Telephone Encounter (Signed)
PT wife is calling to find an alternative to [name]. [employer] will not cover the medication. Please advise. Requesting call back.

## 2023-07-29 NOTE — Telephone Encounter (Signed)
VM left for patient to call back.   Rx for Golytely will be sent to the [facility] pharmacy on file as this is the prep [employer] will cover. New prep instructions to be mailed to address in file. I did make patient aware that due to procedure date being close it is not for sure the prep instructions would come via mail in time. Patient does not have a mychart set up. Hard copy will need to be picked up from the office 2nd floor lobby.

## 2023-07-30 ENCOUNTER — Telehealth: Payer: Self-pay | Admitting: Gastroenterology

## 2023-07-30 NOTE — Telephone Encounter (Signed)
Spoke with patient's wife, states they live in [place] and would not be able to pick up medications. States they will check on his Mychart, and if they cannot locate it they would make special arrangements for someone to pick it up.

## 2023-07-30 NOTE — Telephone Encounter (Signed)
RN contacted patient who needed instructions for his new Golytely prep (previously [name]). Patient has prep instruction letter with Suprep instructions. RN edited letter in Epic and reviewed the new prep instructions with Golytely, which patient wrote down and stated understanding of. Patient will refer to current letter for all instructions other than new prep instructions. RN to mail new Golytely instructions to patient today.

## 2023-07-30 NOTE — Telephone Encounter (Signed)
Patient called and requested a call back to go over prep instructions and how to take the Golytely.

## 2023-08-05 ENCOUNTER — Telehealth: Payer: Self-pay | Admitting: Gastroenterology

## 2023-08-05 NOTE — Telephone Encounter (Signed)
Patient requested instructions to be sent

## 2023-08-06 ENCOUNTER — Encounter: Payer: Self-pay | Admitting: Gastroenterology

## 2023-08-06 ENCOUNTER — Ambulatory Visit: Payer: Medicare PPO | Admitting: Gastroenterology

## 2023-08-06 VITALS — BP 93/67 | HR 62 | Temp 98.0°F | Resp 18 | Ht 69.0 in | Wt 215.0 lb

## 2023-08-06 DIAGNOSIS — Z860100 Personal history of colon polyps, unspecified: Secondary | ICD-10-CM

## 2023-08-06 DIAGNOSIS — Z09 Encounter for follow-up examination after completed treatment for conditions other than malignant neoplasm: Secondary | ICD-10-CM | POA: Diagnosis not present

## 2023-08-06 DIAGNOSIS — K317 Polyp of stomach and duodenum: Secondary | ICD-10-CM | POA: Diagnosis not present

## 2023-08-06 DIAGNOSIS — K2270 Barrett's esophagus without dysplasia: Secondary | ICD-10-CM

## 2023-08-06 DIAGNOSIS — K635 Polyp of colon: Secondary | ICD-10-CM | POA: Diagnosis not present

## 2023-08-06 DIAGNOSIS — D124 Benign neoplasm of descending colon: Secondary | ICD-10-CM | POA: Diagnosis not present

## 2023-08-06 DIAGNOSIS — G4730 Sleep apnea, unspecified: Secondary | ICD-10-CM | POA: Diagnosis not present

## 2023-08-06 DIAGNOSIS — Z1211 Encounter for screening for malignant neoplasm of colon: Secondary | ICD-10-CM | POA: Diagnosis not present

## 2023-08-06 DIAGNOSIS — I10 Essential (primary) hypertension: Secondary | ICD-10-CM | POA: Diagnosis not present

## 2023-08-06 MED ORDER — SODIUM CHLORIDE 0.9 % IV SOLN: 500.0000 mL | INTRAVENOUS | Status: DC

## 2023-08-06 NOTE — Patient Instructions (Signed)
Discharge instructions given. Handouts on polyps,Diverticulosis,Hemorrhoids and Hiatal Hernia. Resume previous medications. YOU HAD AN ENDOSCOPIC PROCEDURE TODAY AT THE [facility]:   Refer to the procedure report that was given to you for any specific questions about what was found during the examination.  If the procedure report does not answer your questions, please call your gastroenterologist to clarify.  If you requested that your care partner not be given the details of your procedure findings, then the procedure report has been included in a sealed envelope for you to review at your convenience later.  YOU SHOULD EXPECT: Some feelings of bloating in the abdomen. Passage of more gas than usual.  Walking can help get rid of the air that was put into your GI tract during the procedure and reduce the bloating. If you had a lower endoscopy (such as a colonoscopy or flexible sigmoidoscopy) you may notice spotting of blood in your stool or on the toilet paper. If you underwent a bowel prep for your procedure, you may not have a normal bowel movement for a few days.  Please Note:  You might notice some irritation and congestion in your nose or some drainage.  This is from the oxygen used during your procedure.  There is no need for concern and it should clear up in a day or so.  SYMPTOMS TO REPORT IMMEDIATELY:  Following lower endoscopy (colonoscopy or flexible sigmoidoscopy):  Excessive amounts of blood in the stool  Significant tenderness or worsening of abdominal pains  Swelling of the abdomen that is new, acute  Fever of 100F or higher  Following upper endoscopy (EGD)  Vomiting of blood or coffee ground material  New chest pain or pain under the shoulder blades  Painful or persistently difficult swallowing  New shortness of breath  Fever of 100F or higher  Black, tarry-looking stools  For urgent or emergent issues, a gastroenterologist can be reached at any hour by calling  [phone]. Do not use MyChart messaging for urgent concerns.    DIET:  We do recommend a small meal at first, but then you may proceed to your regular diet.  Drink plenty of fluids but you should avoid alcoholic beverages for 24 hours.  ACTIVITY:  You should plan to take it easy for the rest of today and you should NOT DRIVE or use heavy machinery until tomorrow (because of the sedation medicines used during the test).    FOLLOW UP: Our staff will call the number listed on your records the next business day following your procedure.  We will call around 7:15- 8:00 am to check on you and address any questions or concerns that you may have regarding the information given to you following your procedure. If we do not reach you, we will leave a message.     If any biopsies were taken you will be contacted by phone or by letter within the next 1-3 weeks.  Please call us at [phone] if you have not heard about the biopsies in 3 weeks.    SIGNATURES/CONFIDENTIALITY: You and/or your care partner have signed paperwork which will be entered into your electronic medical record.  These signatures attest to the fact that that the information above on your After Visit Summary has been reviewed and is understood.  Full responsibility of the confidentiality of this discharge information lies with you and/or your care-partner.

## 2023-08-06 NOTE — Op Note (Signed)
[facility] Patient Name: Anthony Cisneros Procedure Date: 08/06/2023 9:05 AM MRN: [id number] Endoscopist: Lynann Bologna , MD, [id number] Age: 72 Referring MD:  Date of Birth: 16-Sep-1951 Gender: Male Account #: [id number] Procedure:                Upper GI endoscopy Indications:              Previously dx Barrett's esophagus Medicines:                Monitored Anesthesia Care Procedure:                Pre-Anesthesia Assessment:                           - Prior to the procedure, a History and Physical                            was performed, and patient medications and                            allergies were reviewed. The patient's tolerance of                            previous anesthesia was also reviewed. The risks                            and benefits of the procedure and the sedation                            options and risks were discussed with the patient.                            All questions were answered, and informed consent                            was obtained. Prior Anticoagulants: The patient has                            taken no anticoagulant or antiplatelet agents. ASA                            Grade Assessment: II - A patient with mild systemic                            disease. After reviewing the risks and benefits,                            the patient was deemed in satisfactory condition to                            undergo the procedure.                           After obtaining informed consent, the endoscope was  passed under direct vision. Throughout the                            procedure, the patient's blood pressure, pulse, and                            oxygen saturations were monitored continuously. The                            GIF [id number] #[id number] was introduced through the                            mouth, and advanced to the second part of duodenum.                            The upper GI endoscopy was  accomplished without                            difficulty. The patient tolerated the procedure                            well. Scope In: Scope Out: Findings:                 There were esophageal mucosal changes consistent                            with short-segment Barrett's esophagus present in                            the distal esophagus. The maximum longitudinal                            extent of these mucosal changes was 1 cm in length.                            This was biopsied with a cold forceps for                            histology, directed by [facility].                           A 3 cm hiatal hernia was present extending from 35                            up to 38 cm.                           Multiple 6 to 8 mm sessile polyps with no bleeding                            and no stigmata of recent bleeding were found in  the cardia, in the gastric fundus and in the                            gastric body. Not biopsied since previously deemed                            to be fundic gland polyps.                           The examined duodenum was normal. Complications:            No immediate complications. Estimated Blood Loss:     Estimated blood loss: none. Impression:               - Esophageal mucosal changes consistent with                            short-segment Barrett's esophagus. Biopsied.                           - 3 cm hiatal hernia.                           - Multiple gastric polyps (fundic gland polyps).                           - Normal examined duodenum. Recommendation:           - Patient has a contact number available for                            emergencies. The signs and symptoms of potential                            delayed complications were discussed with the                            patient. Return to normal activities tomorrow.                            Written discharge instructions were provided to the                             patient.                           - Resume previous diet.                           - Continue present medications.                           - Await pathology results.                           - The findings and recommendations were discussed  with the patient's family. Lynann Bologna, MD 08/06/2023 9:30:23 AM This report has been signed electronically.

## 2023-08-06 NOTE — Progress Notes (Signed)
Pt's states no medical or surgical changes since previsit or office visit. 

## 2023-08-06 NOTE — Progress Notes (Signed)
Called to room to assist during endoscopic procedure.  Patient ID and intended procedure confirmed with present staff. Received instructions for my participation in the procedure from the performing physician.  

## 2023-08-06 NOTE — Progress Notes (Signed)
Sedate, gd SR, tolerated procedure well, VSS, report to RN 

## 2023-08-06 NOTE — Op Note (Signed)
[facility] Patient Name: Anthony Cisneros Procedure Date: 08/06/2023 9:04 AM MRN: [id number] Endoscopist: Lynann Bologna , MD, [id number] Age: 72 Referring MD:  Date of Birth: Sep 29, 1951 Gender: Male Account #: [id number] Procedure:                Colonoscopy Indications:              High risk colon cancer surveillance: Personal                            history of colonic polyps Medicines:                Monitored Anesthesia Care Procedure:                Pre-Anesthesia Assessment:                           - Prior to the procedure, a History and Physical                            was performed, and patient medications and                            allergies were reviewed. The patient's tolerance of                            previous anesthesia was also reviewed. The risks                            and benefits of the procedure and the sedation                            options and risks were discussed with the patient.                            All questions were answered, and informed consent                            was obtained. Prior Anticoagulants: The patient has                            taken no anticoagulant or antiplatelet agents. ASA                            Grade Assessment: II - A patient with mild systemic                            disease. After reviewing the risks and benefits,                            the patient was deemed in satisfactory condition to                            undergo the procedure.  After obtaining informed consent, the colonoscope                            was passed under direct vision. Throughout the                            procedure, the patient's blood pressure, pulse, and                            oxygen saturations were monitored continuously. The                            CF [id number] #[id number] was introduced through the anus                            and advanced to the the cecum,  identified by                            appendiceal orifice and ileocecal valve. The                            colonoscopy was performed without difficulty. The                            patient tolerated the procedure well. The quality                            of the bowel preparation was good. The ileocecal                            valve, appendiceal orifice, and rectum were                            photographed. Scope In: 9:17:35 AM Scope Out: 9:27:15 AM Scope Withdrawal Time: 0 hours 7 minutes 45 seconds  Total Procedure Duration: 0 hours 9 minutes 40 seconds  Findings:                 A 4 mm polyp was found in the proximal descending                            colon. The polyp was sessile. The polyp was removed                            with a cold snare. Resection and retrieval were                            complete.                           Many medium-mouthed diverticula were found in the                            sigmoid colon and descending colon.  Non-bleeding internal hemorrhoids were found during                            retroflexion. The hemorrhoids were small and Grade                            I (internal hemorrhoids that do not prolapse).                           The exam was otherwise without abnormality on                            direct and retroflexion views. Complications:            No immediate complications. Estimated Blood Loss:     Estimated blood loss: none. Impression:               - One 4 mm polyp in the proximal descending colon,                            removed with a cold snare. Resected and retrieved.                           - Diverticulosis in the sigmoid colon and in the                            descending colon.                           - Non-bleeding internal hemorrhoids.                           - The examination was otherwise normal on direct                            and retroflexion  views. Recommendation:           - Patient has a contact number available for                            emergencies. The signs and symptoms of potential                            delayed complications were discussed with the                            patient. Return to normal activities tomorrow.                            Written discharge instructions were provided to the                            patient.                           - Resume previous diet.                           -  Continue present medications.                           - Await pathology results.                           - Repeat colonoscopy for surveillance based on                            pathology results.                           - The findings and recommendations were discussed                            with the patient's family. Lynann Bologna, MD 08/06/2023 9:32:58 AM This report has been signed electronically.

## 2023-08-06 NOTE — Progress Notes (Signed)
[facility] History and Physical   Primary Care Physician:  Alinda Deem, MD   Reason for Procedure:   H/O Short segment Barrett's, history of polyps  Plan:     EGD/colon     HPI: Anthony Cisneros is a 72 y.o. male    Past Medical History:  Diagnosis Date   Arthritis    GERD (gastroesophageal reflux disease)    Glaucoma    Gout    Hepatitis    History of kidney stones    Hyperlipidemia    Hypertension    LBBB (left bundle branch block)    Sleep apnea    nightly c-pap   Sleep apnea     Past Surgical History:  Procedure Laterality Date   ANKLE RECONSTRUCTION  2009   COLONOSCOPY     LUMBAR LAMINECTOMY/DECOMPRESSION MICRODISCECTOMY Bilateral 03/07/2022   Procedure: BILATERAL LUMBAR TWO-THREE LAMINOTOMY/FORAMINOTOMY;  Surgeon: Tressie Stalker, MD;  Location: [facility] OR;  Service: Neurosurgery;  Laterality: Bilateral;  3C   POLYPECTOMY     ROTATOR CUFF REPAIR Bilateral     Prior to Admission medications   Medication Sig Start Date End Date Taking? Authorizing Provider  acetaminophen (TYLENOL) 500 MG tablet Take 1,000 mg by mouth daily.   Yes [provider]  allopurinol (ZYLOPRIM) 300 MG tablet Take 300 mg by mouth daily. 06/20/20  Yes [provider]  amLODipine (NORVASC) 10 MG tablet Take 10 mg by mouth daily. 06/26/20  Yes [provider]  benazepril (LOTENSIN) 20 MG tablet Take 20 mg by mouth daily. 02/11/22  Yes [provider]  brimonidine (ALPHAGAN P) 0.1 % SOLN Place 1 drop into both eyes See admin instructions. 1 drop 3 times daily in the Left eye, 1 drop daily in the Right eye 12/25/20  Yes [provider]  dorzolamide-timolol (COSOPT) 22.3-6.8 MG/ML ophthalmic solution Place 1 drop into the left eye in the morning and at bedtime. 01/03/22  Yes [provider]  latanoprost (XALATAN) 0.005 % ophthalmic solution Place 1 drop into the left eye at bedtime. 12/08/21  Yes [provider]  omeprazole  (PRILOSEC) 20 MG capsule Take 1 capsule (20 mg total) by mouth 2 (two) times daily before a meal. Patient taking differently: Take 20 mg by mouth daily. 08/07/20  Yes Lynann Bologna, MD  pilocarpine (PILOCAR) 1 % ophthalmic solution PLACE 1 DROP INTO THE LEFT EYE 3 TIMES A DAY 04/29/22  Yes [provider]  amoxicillin (AMOXIL) 500 MG tablet Take 2,000 mg by mouth See admin instructions. Take 1 hour before dental procedures    [provider]  cyclobenzaprine (FLEXERIL) 5 MG tablet Take 1 tablet (5 mg total) by mouth 3 (three) times daily as needed for muscle spasms. Patient not taking: Reported on 07/17/2023 03/08/22   Tressie Stalker, MD  docusate sodium (COLACE) 100 MG capsule Take 1 capsule (100 mg total) by mouth 2 (two) times daily. Patient not taking: Reported on 07/17/2023 03/08/22   Tressie Stalker, MD  loratadine (CLARITIN REDITABS) 10 MG dissolvable tablet Take 10 mg by mouth daily.    [provider]  meloxicam (MOBIC) 15 MG tablet Take 15 mg by mouth daily. 01/03/22   [provider]  oxyCODONE-acetaminophen (PERCOCET/ROXICET) 5-325 MG tablet Take 1-2 tablets by mouth every 4 (four) hours as needed for moderate pain. Patient not taking: Reported on 07/17/2023 03/08/22   Tressie Stalker, MD    Current Outpatient Medications  Medication Sig Dispense Refill   acetaminophen (TYLENOL) 500 MG tablet Take 1,000 mg  by mouth daily.     allopurinol (ZYLOPRIM) 300 MG tablet Take 300 mg by mouth daily.     amLODipine (NORVASC) 10 MG tablet Take 10 mg by mouth daily.     benazepril (LOTENSIN) 20 MG tablet Take 20 mg by mouth daily.     brimonidine (ALPHAGAN P) 0.1 % SOLN Place 1 drop into both eyes See admin instructions. 1 drop 3 times daily in the Left eye, 1 drop daily in the Right eye     dorzolamide-timolol (COSOPT) 22.3-6.8 MG/ML ophthalmic solution Place 1 drop into the left eye in the morning and at bedtime.     latanoprost (XALATAN) 0.005 % ophthalmic  solution Place 1 drop into the left eye at bedtime.     omeprazole (PRILOSEC) 20 MG capsule Take 1 capsule (20 mg total) by mouth 2 (two) times daily before a meal. (Patient taking differently: Take 20 mg by mouth daily.) 60 capsule 11   pilocarpine (PILOCAR) 1 % ophthalmic solution PLACE 1 DROP INTO THE LEFT EYE 3 TIMES A DAY     amoxicillin (AMOXIL) 500 MG tablet Take 2,000 mg by mouth See admin instructions. Take 1 hour before dental procedures     cyclobenzaprine (FLEXERIL) 5 MG tablet Take 1 tablet (5 mg total) by mouth 3 (three) times daily as needed for muscle spasms. (Patient not taking: Reported on 07/17/2023) 50 tablet 0   docusate sodium (COLACE) 100 MG capsule Take 1 capsule (100 mg total) by mouth 2 (two) times daily. (Patient not taking: Reported on 07/17/2023) 60 capsule 0   loratadine (CLARITIN REDITABS) 10 MG dissolvable tablet Take 10 mg by mouth daily.     meloxicam (MOBIC) 15 MG tablet Take 15 mg by mouth daily.     oxyCODONE-acetaminophen (PERCOCET/ROXICET) 5-325 MG tablet Take 1-2 tablets by mouth every 4 (four) hours as needed for moderate pain. (Patient not taking: Reported on 07/17/2023) 30 tablet 0   No current facility-administered medications for this visit.    Allergies as of 08/06/2023 - Review Complete 08/06/2023  Allergen Reaction Noted   Codeine Other (See Comments) 08/04/2007    Family History  Problem Relation Age of Onset   Colon cancer Neg Hx    Esophageal cancer Neg Hx    Stomach cancer Neg Hx    Rectal cancer Neg Hx    Colon polyps Neg Hx     Social History   Socioeconomic History   Marital status: Married    Spouse name: Not on file   Number of children: Not on file   Years of education: Not on file   Highest education level: Not on file  Occupational History   Not on file  Tobacco Use   Smoking status: Former    Current packs/day: 0.00    Average packs/day: 1.5 packs/day for 15.0 years (22.5 ttl pk-yrs)    Types: Cigarettes    Start  date: 12    Quit date: 1984    Years since quitting: 40.8   Smokeless tobacco: Never  Vaping Use   Vaping status: Never Used  Substance and Sexual Activity   Alcohol use: Yes    Alcohol/week: 14.0 standard drinks of alcohol    Types: 14 Cans of beer per week   Drug use: Never   Sexual activity: Not on file  Other Topics Concern   Not on file  Social History Narrative   Not on file   Social Determinants of Health   Financial Resource Strain: Low Risk  (  04/27/2023)   Received from [facility]   Overall Financial Resource Strain (CARDIA)    Difficulty of Paying Living Expenses: Not hard at all  Food Insecurity: No Food Insecurity (04/27/2023)   Received from [facility]   Hunger Vital Sign    Worried About Running Out of Food in the Last Year: Never true    Ran Out of Food in the Last Year: Never true  Transportation Needs: No Transportation Needs (04/27/2023)   Received from [facility] - Transportation    Lack of Transportation (Medical): No    Lack of Transportation (Non-Medical): No  Physical Activity: Insufficiently Active (04/27/2023)   Received from [facility]   Exercise Vital Sign    Days of Exercise per Week: 2 days    Minutes of Exercise per Session: 30 min  Stress: No Stress Concern Present (04/27/2023)   Received from [facility] - Occupational Stress Questionnaire    Feeling of Stress : Not at all  Social Connections: Socially Integrated (04/27/2023)   Received from [facility]   Social Network    How would you rate your social network (family, work, friends)?: Good participation with social networks  Intimate Partner Violence: Not At Risk (04/27/2023)   Received from [facility]   HITS    Over the last 12 months how often did your partner physically hurt you?: 1    Over the last 12 months how often did your partner insult you or talk down to you?: 2    Over the last 12 months how often did  your partner threaten you with physical harm?: 1    Over the last 12 months how often did your partner scream or curse at you?: 2    Review of Systems: Positive for none All other review of systems negative except as mentioned in the HPI.  Physical Exam: Vital signs in last 24 hours: @VSRANGES @   General:   Alert,  Well-developed, well-nourished, pleasant and cooperative in NAD Lungs:  Clear throughout to auscultation.   Heart:  Regular rate and rhythm; no murmurs, clicks, rubs,  or gallops. Abdomen:  Soft, nontender and nondistended. Normal bowel sounds.   Neuro/Psych:  Alert and cooperative. Normal mood and affect. A and O x 3    No significant changes were identified.  The patient continues to be an appropriate candidate for the planned procedure and anesthesia.   Edman Circle, MD. [facility] 08/06/2023 8:58 AM@

## 2023-08-07 ENCOUNTER — Telehealth: Payer: Self-pay

## 2023-08-07 NOTE — Telephone Encounter (Signed)
Attempted f/u call. No answer, VM full.

## 2023-08-07 NOTE — Telephone Encounter (Signed)
Patients wife called stating patient was doing good.

## 2023-08-08 LAB — SURGICAL PATHOLOGY

## 2023-08-14 ENCOUNTER — Encounter: Payer: Self-pay | Admitting: Gastroenterology

## 2023-10-06 DIAGNOSIS — E669 Obesity, unspecified: Secondary | ICD-10-CM | POA: Diagnosis not present

## 2023-10-06 DIAGNOSIS — M79671 Pain in right foot: Secondary | ICD-10-CM | POA: Diagnosis not present

## 2023-10-06 DIAGNOSIS — I10 Essential (primary) hypertension: Secondary | ICD-10-CM | POA: Diagnosis not present

## 2023-10-06 DIAGNOSIS — K219 Gastro-esophageal reflux disease without esophagitis: Secondary | ICD-10-CM | POA: Diagnosis not present

## 2023-10-06 DIAGNOSIS — Z1389 Encounter for screening for other disorder: Secondary | ICD-10-CM | POA: Diagnosis not present

## 2023-10-06 DIAGNOSIS — H401133 Primary open-angle glaucoma, bilateral, severe stage: Secondary | ICD-10-CM | POA: Diagnosis not present

## 2023-10-06 DIAGNOSIS — Z0000 Encounter for general adult medical examination without abnormal findings: Secondary | ICD-10-CM | POA: Diagnosis not present

## 2023-10-06 DIAGNOSIS — Z6833 Body mass index (BMI) 33.0-33.9, adult: Secondary | ICD-10-CM | POA: Diagnosis not present

## 2023-10-07 DIAGNOSIS — M48062 Spinal stenosis, lumbar region with neurogenic claudication: Secondary | ICD-10-CM | POA: Diagnosis not present

## 2023-10-16 DIAGNOSIS — I10 Essential (primary) hypertension: Secondary | ICD-10-CM | POA: Diagnosis not present

## 2023-11-17 DIAGNOSIS — M48062 Spinal stenosis, lumbar region with neurogenic claudication: Secondary | ICD-10-CM | POA: Diagnosis not present

## 2024-01-01 DIAGNOSIS — H524 Presbyopia: Secondary | ICD-10-CM | POA: Diagnosis not present

## 2024-01-01 DIAGNOSIS — H401131 Primary open-angle glaucoma, bilateral, mild stage: Secondary | ICD-10-CM | POA: Diagnosis not present

## 2024-01-01 DIAGNOSIS — D23112 Other benign neoplasm of skin of right lower eyelid, including canthus: Secondary | ICD-10-CM | POA: Diagnosis not present

## 2024-01-05 DIAGNOSIS — G4730 Sleep apnea, unspecified: Secondary | ICD-10-CM | POA: Diagnosis not present

## 2024-01-05 DIAGNOSIS — Z6833 Body mass index (BMI) 33.0-33.9, adult: Secondary | ICD-10-CM | POA: Diagnosis not present

## 2024-01-05 DIAGNOSIS — M1000 Idiopathic gout, unspecified site: Secondary | ICD-10-CM | POA: Diagnosis not present

## 2024-01-05 DIAGNOSIS — Z79899 Other long term (current) drug therapy: Secondary | ICD-10-CM | POA: Diagnosis not present

## 2024-01-05 DIAGNOSIS — K219 Gastro-esophageal reflux disease without esophagitis: Secondary | ICD-10-CM | POA: Diagnosis not present

## 2024-01-05 DIAGNOSIS — Z125 Encounter for screening for malignant neoplasm of prostate: Secondary | ICD-10-CM | POA: Diagnosis not present

## 2024-01-05 DIAGNOSIS — E7800 Pure hypercholesterolemia, unspecified: Secondary | ICD-10-CM | POA: Diagnosis not present

## 2024-01-05 DIAGNOSIS — I10 Essential (primary) hypertension: Secondary | ICD-10-CM | POA: Diagnosis not present

## 2024-01-05 DIAGNOSIS — E669 Obesity, unspecified: Secondary | ICD-10-CM | POA: Diagnosis not present

## 2024-01-20 DIAGNOSIS — D23112 Other benign neoplasm of skin of right lower eyelid, including canthus: Secondary | ICD-10-CM | POA: Diagnosis not present

## 2024-02-02 DIAGNOSIS — M48062 Spinal stenosis, lumbar region with neurogenic claudication: Secondary | ICD-10-CM | POA: Diagnosis not present

## 2024-02-09 DIAGNOSIS — H401133 Primary open-angle glaucoma, bilateral, severe stage: Secondary | ICD-10-CM | POA: Diagnosis not present

## 2024-04-05 DIAGNOSIS — Z6834 Body mass index (BMI) 34.0-34.9, adult: Secondary | ICD-10-CM | POA: Diagnosis not present

## 2024-04-05 DIAGNOSIS — E669 Obesity, unspecified: Secondary | ICD-10-CM | POA: Diagnosis not present

## 2024-04-05 DIAGNOSIS — G4730 Sleep apnea, unspecified: Secondary | ICD-10-CM | POA: Diagnosis not present

## 2024-04-05 DIAGNOSIS — M7021 Olecranon bursitis, right elbow: Secondary | ICD-10-CM | POA: Diagnosis not present

## 2024-04-05 DIAGNOSIS — E7800 Pure hypercholesterolemia, unspecified: Secondary | ICD-10-CM | POA: Diagnosis not present

## 2024-04-05 DIAGNOSIS — M1000 Idiopathic gout, unspecified site: Secondary | ICD-10-CM | POA: Diagnosis not present

## 2024-04-05 DIAGNOSIS — I10 Essential (primary) hypertension: Secondary | ICD-10-CM | POA: Diagnosis not present

## 2024-04-05 DIAGNOSIS — K219 Gastro-esophageal reflux disease without esophagitis: Secondary | ICD-10-CM | POA: Diagnosis not present

## 2024-04-06 DIAGNOSIS — H401133 Primary open-angle glaucoma, bilateral, severe stage: Secondary | ICD-10-CM | POA: Diagnosis not present

## 2024-04-06 DIAGNOSIS — H2513 Age-related nuclear cataract, bilateral: Secondary | ICD-10-CM | POA: Diagnosis not present

## 2024-04-19 DIAGNOSIS — M7021 Olecranon bursitis, right elbow: Secondary | ICD-10-CM | POA: Diagnosis not present

## 2024-05-10 DIAGNOSIS — M7021 Olecranon bursitis, right elbow: Secondary | ICD-10-CM | POA: Diagnosis not present

## 2024-06-14 DIAGNOSIS — H401123 Primary open-angle glaucoma, left eye, severe stage: Secondary | ICD-10-CM | POA: Diagnosis not present

## 2024-06-14 DIAGNOSIS — H401113 Primary open-angle glaucoma, right eye, severe stage: Secondary | ICD-10-CM | POA: Diagnosis not present

## 2024-08-04 DIAGNOSIS — M542 Cervicalgia: Secondary | ICD-10-CM | POA: Diagnosis not present

## 2024-08-04 DIAGNOSIS — K219 Gastro-esophageal reflux disease without esophagitis: Secondary | ICD-10-CM | POA: Diagnosis not present

## 2024-08-04 DIAGNOSIS — Z23 Encounter for immunization: Secondary | ICD-10-CM | POA: Diagnosis not present

## 2024-08-04 DIAGNOSIS — M1000 Idiopathic gout, unspecified site: Secondary | ICD-10-CM | POA: Diagnosis not present

## 2024-08-04 DIAGNOSIS — G4730 Sleep apnea, unspecified: Secondary | ICD-10-CM | POA: Diagnosis not present

## 2024-08-04 DIAGNOSIS — E669 Obesity, unspecified: Secondary | ICD-10-CM | POA: Diagnosis not present

## 2024-08-04 DIAGNOSIS — I10 Essential (primary) hypertension: Secondary | ICD-10-CM | POA: Diagnosis not present

## 2024-08-04 DIAGNOSIS — Z6834 Body mass index (BMI) 34.0-34.9, adult: Secondary | ICD-10-CM | POA: Diagnosis not present

## 2024-08-04 DIAGNOSIS — E7800 Pure hypercholesterolemia, unspecified: Secondary | ICD-10-CM | POA: Diagnosis not present

## 2024-10-07 DIAGNOSIS — Z6833 Body mass index (BMI) 33.0-33.9, adult: Secondary | ICD-10-CM | POA: Diagnosis not present

## 2024-10-07 DIAGNOSIS — Z0000 Encounter for general adult medical examination without abnormal findings: Secondary | ICD-10-CM | POA: Diagnosis not present

## 2024-10-07 DIAGNOSIS — J209 Acute bronchitis, unspecified: Secondary | ICD-10-CM | POA: Diagnosis not present

## 2024-10-07 DIAGNOSIS — Z1389 Encounter for screening for other disorder: Secondary | ICD-10-CM | POA: Diagnosis not present

## 2024-10-07 DIAGNOSIS — E669 Obesity, unspecified: Secondary | ICD-10-CM | POA: Diagnosis not present
